# Patient Record
Sex: Male | Born: 1965 | Race: White | Hispanic: No | Marital: Married | Smoking: Never smoker
Health system: Southern US, Community
[De-identification: ages and names within clinical notes are randomized; demographics above are authoritative.]

## PROBLEM LIST (undated history)

## (undated) DIAGNOSIS — G4730 Sleep apnea, unspecified: Secondary | ICD-10-CM

## (undated) DIAGNOSIS — E119 Type 2 diabetes mellitus without complications: Secondary | ICD-10-CM

## (undated) DIAGNOSIS — K219 Gastro-esophageal reflux disease without esophagitis: Secondary | ICD-10-CM

## (undated) DIAGNOSIS — I10 Essential (primary) hypertension: Secondary | ICD-10-CM

## (undated) DIAGNOSIS — R0600 Dyspnea, unspecified: Secondary | ICD-10-CM

## (undated) HISTORY — PX: TOE SURGERY: SHX1073

## (undated) HISTORY — PX: HAND SURGERY: SHX662

## (undated) HISTORY — PX: WRIST SURGERY: SHX841

## (undated) HISTORY — PX: TONSILLECTOMY: SUR1361

---

## 2004-07-07 ENCOUNTER — Emergency Department: Payer: Self-pay | Admitting: Unknown Physician Specialty

## 2017-09-19 ENCOUNTER — Other Ambulatory Visit: Payer: Self-pay

## 2017-09-19 ENCOUNTER — Encounter: Payer: Self-pay | Admitting: Gynecology

## 2017-09-19 ENCOUNTER — Ambulatory Visit
Admission: EM | Admit: 2017-09-19 | Discharge: 2017-09-19 | Disposition: A | Discharge status: Home / Self Care | Payer: 59 | Attending: Family Medicine | Admitting: Family Medicine

## 2017-09-19 DIAGNOSIS — J069 Acute upper respiratory infection, unspecified: Secondary | ICD-10-CM | POA: Diagnosis not present

## 2017-09-19 DIAGNOSIS — R05 Cough: Secondary | ICD-10-CM

## 2017-09-19 MED ORDER — HYDROCOD POLST-CPM POLST ER 10-8 MG/5ML PO SUER: 5.0000 mL | Freq: Two times a day (BID) | ORAL | 0 refills | Status: DC

## 2017-09-19 MED ORDER — BENZONATATE 200 MG PO CAPS: ORAL_CAPSULE | ORAL | 0 refills | Status: DC

## 2017-09-19 MED ORDER — AZITHROMYCIN 250 MG PO TABS: 250.0000 mg | ORAL_TABLET | Freq: Every day | ORAL | 0 refills | Status: DC

## 2017-09-19 MED ORDER — ALBUTEROL SULFATE HFA 108 (90 BASE) MCG/ACT IN AERS: 1.0000 | INHALATION_SPRAY | Freq: Four times a day (QID) | RESPIRATORY_TRACT | 0 refills | Status: DC | PRN

## 2017-09-19 NOTE — ED Triage Notes (Signed)
Patient c/o cough and chest congestion x couple weeks.

## 2017-09-19 NOTE — ED Provider Notes (Signed)
[facility]    CSN: [id number] Arrival date & time: 09/19/17  1016     History   Chief Complaint Chief Complaint  Patient presents with  . Cough    HPI Jack Barajas is a 52 y.o. male.   HPI  This is a 52 year old male presents with a cough and congestion he has had since Thanksgiving.  States that his upper cold in his nose and throat improved with over-the-counter medications but he is been coughing nonproductively since Thanksgiving.  Last couple weeks it reemerged only worse.  He is a non-smoker.  He states that he has had no fever or chills although his wife told him he felt clammy last night.  Present today.  Dates he has a wheezing sensation at nighttime and his symptoms always seem to be worse at night although he will be coughing during the daytime.        History reviewed. No pertinent past medical history.  There are no active problems to display for this patient.   Past Surgical History:  Procedure Laterality Date  . TOE SURGERY    . WRIST SURGERY         Home Medications    Prior to Admission medications   Medication Sig Start Date End Date Taking? Authorizing Provider  albuterol (PROVENTIL HFA;VENTOLIN HFA) 108 (90 Base) MCG/ACT inhaler Inhale 1-2 puffs into the lungs every 6 (six) hours as needed for wheezing or shortness of breath. Use with spacer 09/19/17   Lorin Picket, PA-C  azithromycin (ZITHROMAX) 250 MG tablet Take 1 tablet (250 mg total) by mouth daily. Take first 2 tablets together, then 1 every day until finished. 09/19/17   Lorin Picket, PA-C  benzonatate (TESSALON) 200 MG capsule Take one cap TID PRN cough 09/19/17   Lorin Picket, PA-C  chlorpheniramine-HYDROcodone [facility]) 10-8 MG/5ML SUER Take 5 mLs by mouth 2 (two) times daily. 09/19/17   Lorin Picket, PA-C    Family History Family History  Problem Relation Age of Onset  . Hypertension Mother   . Cancer Father        bone    Social  History Social History   Tobacco Use  . Smoking status: Never Smoker  . Smokeless tobacco: Never Used  Substance Use Topics  . Alcohol use: Yes  . Drug use: No     Allergies   Patient has no known allergies.   Review of Systems Review of Systems  Constitutional: Positive for activity change. Negative for chills, fatigue and fever.  HENT: Positive for congestion.   Respiratory: Positive for cough, shortness of breath and wheezing.   All other systems reviewed and are negative.    Physical Exam Triage Vital Signs ED Triage Vitals  Enc Vitals Group     BP 09/19/17 1039 (!) 139/94     Pulse Rate 09/19/17 1039 90     Resp 09/19/17 1039 18     Temp 09/19/17 1039 98.4 F (36.9 C)     Temp Source 09/19/17 1039 Oral     SpO2 09/19/17 1039 98 %     Weight 09/19/17 1036 285 lb (129.3 kg)     Height 09/19/17 1036 6\' 1"  (1.854 m)     Head Circumference --      Peak Flow --      Pain Score 09/19/17 1036 3     Pain Loc --      Pain Edu? --  Excl. in [place]? --    No data found.  Updated Vital Signs BP (!) 139/94 (BP Location: Left Arm)   Pulse 90   Temp 98.4 F (36.9 C) (Oral)   Resp 18   Ht 6\' 1"  (1.854 m)   Wt 285 lb (129.3 kg)   SpO2 98%   BMI 37.60 kg/m   Visual Acuity Right Eye Distance:   Left Eye Distance:   Bilateral Distance:    Right Eye Near:   Left Eye Near:    Bilateral Near:     Physical Exam  Constitutional: He is oriented to person, place, and time. He appears well-developed and well-nourished. No distress.  HENT:  Head: Normocephalic.  Right Ear: External ear normal.  Left Ear: External ear normal.  Nose: Nose normal.  Mouth/Throat: Oropharynx is clear and moist. No oropharyngeal exudate.  Eyes: Pupils are equal, round, and reactive to light. Right eye exhibits no discharge. Left eye exhibits no discharge.  Neck: Normal range of motion.  Pulmonary/Chest: Effort normal and breath sounds normal.  Musculoskeletal: Normal range of motion.    Lymphadenopathy:    He has no cervical adenopathy.  Neurological: He is alert and oriented to person, place, and time.  Skin: Skin is warm and dry. He is not diaphoretic.  Psychiatric: He has a normal mood and affect. His behavior is normal. Judgment and thought content normal.  Nursing note and vitals reviewed.    UC Treatments / Results  Labs (all labs ordered are listed, but only abnormal results are displayed) Labs Reviewed - No data to display  EKG  EKG Interpretation None       Radiology No results found.  Procedures Procedures (including critical care time)  Medications Ordered in UC Medications - No data to display   Initial Impression / Assessment and Plan / UC Course  I have reviewed the triage vital signs and the nursing notes.  Pertinent labs & imaging results that were available during my care of the patient were reviewed by me and considered in my medical decision making (see chart for details).     Plan: 1. Test/x-ray results and diagnosis reviewed with patient 2. rx as per orders; risks, benefits, potential side effects reviewed with patient 3. Recommend supportive treatment with rest and hydration. Since  He has symptoms since Thanksgiving and  not improved, I will prescribe a course of azithromycin.  In addition I will treat his cough symptomatically.  If he is not improving I recommend that he follow-up with a primary care physician of his choice. 4. F/u prn if symptoms worsen or don't improve   Final Clinical Impressions(s) / UC Diagnoses   Final diagnoses:  Upper respiratory tract infection, unspecified type    ED Discharge Orders        Ordered    chlorpheniramine-HYDROcodone (TUSSIONEX PENNKINETIC ER) 10-8 MG/5ML SUER  2 times daily     09/19/17 1121    benzonatate (TESSALON) 200 MG capsule     09/19/17 1121    albuterol (PROVENTIL HFA;VENTOLIN HFA) 108 (90 Base) MCG/ACT inhaler  Every 6 hours PRN    Comments:  Provide spacer and  instructions to patient   09/19/17 1121    azithromycin (ZITHROMAX) 250 MG tablet  Daily     09/19/17 1121       Controlled Substance Prescriptions  Controlled Substance Registry consulted? Not Applicable   Lorin Picket, PA-C 09/19/17 1128

## 2018-03-21 ENCOUNTER — Ambulatory Visit (INDEPENDENT_AMBULATORY_CARE_PROVIDER_SITE_OTHER): Payer: 59

## 2018-03-21 ENCOUNTER — Other Ambulatory Visit: Payer: Self-pay

## 2018-03-21 ENCOUNTER — Encounter: Payer: Self-pay | Admitting: Emergency Medicine

## 2018-03-21 ENCOUNTER — Ambulatory Visit
Admission: EM | Admit: 2018-03-21 | Discharge: 2018-03-21 | Disposition: A | Discharge status: Home / Self Care | Payer: 59 | Attending: Family Medicine | Admitting: Family Medicine

## 2018-03-21 DIAGNOSIS — J181 Lobar pneumonia, unspecified organism: Secondary | ICD-10-CM

## 2018-03-21 DIAGNOSIS — R05 Cough: Secondary | ICD-10-CM

## 2018-03-21 DIAGNOSIS — R059 Cough, unspecified: Secondary | ICD-10-CM

## 2018-03-21 DIAGNOSIS — J189 Pneumonia, unspecified organism: Secondary | ICD-10-CM

## 2018-03-21 MED ORDER — HYDROCOD POLST-CPM POLST ER 10-8 MG/5ML PO SUER: 5.0000 mL | Freq: Every evening | ORAL | 0 refills | Status: DC | PRN

## 2018-03-21 MED ORDER — PREDNISONE 10 MG PO TABS: ORAL_TABLET | ORAL | 0 refills | Status: DC

## 2018-03-21 MED ORDER — BENZONATATE 100 MG PO CAPS: 100.0000 mg | ORAL_CAPSULE | Freq: Three times a day (TID) | ORAL | 0 refills | Status: DC | PRN

## 2018-03-21 MED ORDER — LEVOFLOXACIN 750 MG PO TABS: 750.0000 mg | ORAL_TABLET | Freq: Every day | ORAL | 0 refills | Status: DC

## 2018-03-21 MED ORDER — ALBUTEROL SULFATE HFA 108 (90 BASE) MCG/ACT IN AERS: 2.0000 | INHALATION_SPRAY | RESPIRATORY_TRACT | 0 refills | Status: DC | PRN

## 2018-03-21 MED ORDER — IPRATROPIUM-ALBUTEROL 0.5-2.5 (3) MG/3ML IN SOLN
3.0000 mL | Freq: Once | RESPIRATORY_TRACT | Status: AC
  Administered 2018-03-21: 3 mL via RESPIRATORY_TRACT

## 2018-03-21 NOTE — ED Triage Notes (Signed)
Patient in today c/o non-productive cough x 1 week. Patient was seen at an Urgent Care in [place] on 03/16/18 and was prescribed a Zpak, Tussionex and Tessalon Perles. Patient has finished his medication, but isn't any better.

## 2018-03-21 NOTE — Discharge Instructions (Addendum)
Take medication as prescribed. Rest. Drink plenty of fluids.  ° °Follow up with your primary care physician this week as needed. Return to Urgent care for new or worsening concerns.  ° °

## 2018-03-21 NOTE — ED Provider Notes (Signed)
[facility] ____________________________________________  Time seen: Approximately 11:39 AM  I have reviewed the triage vital signs and the nursing notes.   HISTORY  Chief Complaint Cough   HPI Jack Barajas is a 52 y.o. male presenting for evaluation of cough and congestion symptoms that have been present for 1 week.  Patient states symptoms started on Friday and he was seen in urgent care in [place] on Sunday.  States that time he was given cough medication and azithromycin in which she has completed and symptoms continue.  Reports intermittent wheezing.  States cough is disrupting sleep.  States cough is a nonproductive hacking cough.  Denies associated fevers.  No chest pain or shortness of breath.  Does report recent travel to [place], denies any atypical exposures.  No extremity pain or extremity swelling.  Continues to eat and drink well.  States unresolved with prescription medications as well as over-the-counter.  Denies home sick contacts.  Denies other aggravating or alleviating factors.  Denies any history of pneumonia, asthma, COPD or smoking.  Denies chest pain, shortness of breath, abdominal pain,extremity pain, extremity swelling or rash.   History reviewed. No pertinent past medical history.  There are no active problems to display for this patient.   Past Surgical History:  Procedure Laterality Date  . TOE SURGERY    . WRIST SURGERY       No current facility-administered medications for this encounter.   Current Outpatient Medications:  .  albuterol (PROVENTIL HFA;VENTOLIN HFA) 108 (90 Base) MCG/ACT inhaler, Inhale 2 puffs into the lungs every 4 (four) hours as needed., Disp: 1 Inhaler, Rfl: 0 .  benzonatate (TESSALON PERLES) 100 MG capsule, Take 1 capsule (100 mg total) by mouth 3 (three) times daily as needed., Disp: 15 capsule, Rfl: 0 .  chlorpheniramine-HYDROcodone (TUSSIONEX PENNKINETIC ER) 10-8 MG/5ML SUER, Take 5 mLs by mouth at  bedtime as needed for cough. do not drive or operate machinery while taking as can cause drowsiness., Disp: 50 mL, Rfl: 0 .  levofloxacin (LEVAQUIN) 750 MG tablet, Take 1 tablet (750 mg total) by mouth daily., Disp: 5 tablet, Rfl: 0 .  predniSONE (DELTASONE) 10 MG tablet, Start 60 mg po day one, then 50 mg po day two, taper by 10 mg daily until complete., Disp: 21 tablet, Rfl: 0  Allergies Patient has no known allergies.  Family History  Problem Relation Age of Onset  . Hypertension Mother   . Cancer Father        bone    Social History Social History   Tobacco Use  . Smoking status: Never Smoker  . Smokeless tobacco: Never Used  Substance Use Topics  . Alcohol use: Yes    Comment: occassionally  . Drug use: No    Review of Systems Constitutional: No fever/chills ENT: No sore throat. Cardiovascular: Denies chest pain. Respiratory: Denies shortness of breath. Gastrointestinal: No abdominal pain.   Musculoskeletal: Negative for back pain. Skin: Negative for rash.  ____________________________________________   PHYSICAL EXAM:  VITAL SIGNS: ED Triage Vitals  Enc Vitals Group     BP 03/21/18 1034 (!) 139/94     Pulse Rate 03/21/18 1034 (!) 102     Resp 03/21/18 1034 16     Temp 03/21/18 1034 98.4 F (36.9 C)     Temp Source 03/21/18 1034 Oral     SpO2 03/21/18 1034 97 %     Weight 03/21/18 1035 270 lb (122.5 kg)     Height 03/21/18 1035 6' (  1.829 m)     Head Circumference --      Peak Flow --      Pain Score 03/21/18 1035 4     Pain Loc --      Pain Edu? --      Excl. in [place]? --     Constitutional: Alert and oriented. Well appearing and in no acute distress. Eyes: Conjunctivae are normal. Head: Atraumatic. No sinus tenderness to palpation. No swelling. No erythema.  Ears: no erythema, normal TMs bilaterally.   Nose:Nasal congestion with clear rhinorrhea  Mouth/Throat: Mucous membranes are moist. No pharyngeal erythema. No tonsillar swelling or exudate.    Neck: No stridor.  No cervical spine tenderness to palpation. Hematological/Lymphatic/Immunilogical: No cervical lymphadenopathy. Cardiovascular: Normal rate, regular rhythm. Grossly normal heart sounds.  Good peripheral circulation. Respiratory: Normal respiratory effort.  No retractions.  Diminished bilateral base sounds.  Mild scattered rhonchi.  Mild scattered wheezes.  Dry intermittent cough noted in room.  Speaks in complete sentences. Musculoskeletal: Ambulatory with steady gait. No cervical, thoracic or lumbar tenderness to palpation. Neurologic:  Normal speech and language. No gait instability. Skin:  Skin appears warm, dry and intact. No rash noted. Psychiatric: Mood and affect are normal. Speech and behavior are normal.  ___________________________________________   LABS (all labs ordered are listed, but only abnormal results are displayed)  Labs Reviewed - No data to display  RADIOLOGY  Dg Chest 2 View  Result Date: 03/21/2018 CLINICAL DATA:  Chest pressure and wheezing over the last week with shortness of breath and dry cough. EXAM: CHEST - 2 VIEW COMPARISON:  None. FINDINGS: Heart size is normal. There is patchy infiltrate/atelectasis in both lower lungs. No dense consolidation or lobar collapse. No effusions. No acute bone finding. IMPRESSION: Patchy pneumonia in both lower lungs. Electronically Signed   By: Nelson Chimes M.D.   On: 03/21/2018 11:07   ____________________________________________   PROCEDURES Procedures   INITIAL IMPRESSION / ASSESSMENT AND PLAN / ED COURSE  Pertinent labs & imaging results that were available during my care of the patient were reviewed by me and considered in my medical decision making (see chart for details).  Well-appearing patient.  No acute distress.  DuoNeb given in urgent care, post reevaluation, wheezes improved.  Rhonchi continued.  Chest x-ray as above per radiologist, patchy pneumonia in both lower lobes.  As patient just  completed a azithromycin, will treat patient with oral Levaquin, counseled regarding fluids and initiate his activity due to tendon risk.  Also treat with prednisone taper, albuterol inhaler, PRN Tussionex and PRN Tessalon Perles.   Discussed her follow-up and return parameters.Discussed indication, risks and benefits of medications with patient.  Discussed follow up with Primary care physician this week. Discussed follow up and return parameters including no resolution or any worsening concerns. Patient verbalized understanding and agreed to plan.   ____________________________________________   FINAL CLINICAL IMPRESSION(S) / ED DIAGNOSES  Final diagnoses:  Pneumonia of both lower lobes due to infectious organism [facility])  Cough     ED Discharge Orders        Ordered    levofloxacin (LEVAQUIN) 750 MG tablet  Daily     03/21/18 1124    predniSONE (DELTASONE) 10 MG tablet     03/21/18 1124    albuterol (PROVENTIL HFA;VENTOLIN HFA) 108 (90 Base) MCG/ACT inhaler  Every 4 hours PRN     03/21/18 1124    chlorpheniramine-HYDROcodone (TUSSIONEX PENNKINETIC ER) 10-8 MG/5ML SUER  At bedtime PRN  03/21/18 1124    benzonatate (TESSALON PERLES) 100 MG capsule  3 times daily PRN     03/21/18 1124       Note: This dictation was prepared with Dragon dictation along with smaller phrase technology. Any transcriptional errors that result from this process are unintentional.         Marylene Land, NP 03/21/18 1142

## 2020-02-22 DIAGNOSIS — M170 Bilateral primary osteoarthritis of knee: Secondary | ICD-10-CM | POA: Insufficient documentation

## 2020-02-23 DIAGNOSIS — E7800 Pure hypercholesterolemia, unspecified: Secondary | ICD-10-CM | POA: Insufficient documentation

## 2020-09-15 ENCOUNTER — Other Ambulatory Visit: Payer: Self-pay

## 2020-09-15 ENCOUNTER — Ambulatory Visit (INDEPENDENT_AMBULATORY_CARE_PROVIDER_SITE_OTHER): Payer: 59

## 2020-09-15 ENCOUNTER — Ambulatory Visit
Admission: RE | Admit: 2020-09-15 | Discharge: 2020-09-15 | Disposition: A | Discharge status: Home / Self Care | Payer: 59 | Source: Ambulatory Visit | Attending: Physician Assistant | Admitting: Physician Assistant

## 2020-09-15 VITALS — BP 139/107 | HR 103 | Temp 98.6°F | Resp 16

## 2020-09-15 DIAGNOSIS — U071 COVID-19: Secondary | ICD-10-CM

## 2020-09-15 DIAGNOSIS — J189 Pneumonia, unspecified organism: Secondary | ICD-10-CM

## 2020-09-15 DIAGNOSIS — R059 Cough, unspecified: Secondary | ICD-10-CM | POA: Diagnosis not present

## 2020-09-15 LAB — RESP PANEL BY RT-PCR (FLU A&B, COVID) ARPGX2
Influenza A by PCR: NEGATIVE
Influenza B by PCR: NEGATIVE
SARS Coronavirus 2 by RT PCR: POSITIVE — AB

## 2020-09-15 MED ORDER — DOXYCYCLINE HYCLATE 100 MG PO CAPS: 100.0000 mg | ORAL_CAPSULE | Freq: Two times a day (BID) | ORAL | 0 refills | Status: DC

## 2020-09-15 MED ORDER — PROMETHAZINE-DM 6.25-15 MG/5ML PO SYRP: 5.0000 mL | ORAL_SOLUTION | Freq: Every evening | ORAL | 0 refills | Status: DC | PRN

## 2020-09-15 NOTE — Discharge Instructions (Signed)
Your Covid test is positive.  You must isolate another 7 days.  We did a chest x-ray since you have been having a cough prior to that for a couple of months.  The chest x-ray does show that you have pneumonia of your left lower lobe.  I am unsure if you already had walking pneumonia and then you acquired Covid or if you have COVID type pneumonia.  Since I am uncertain, I am sending doxycycline to the pharmacy for you.  I have also sent Promethazine DM cough syrup for you to take at nighttime.  Take Mucinex D during the day and increase your fluid intake.  Since you are unvaccinated and you have pneumonia as well as history of prediabetes and high cholesterol you are at a higher risk for getting sicker.  I will contact the infusion center for consideration of monoclonal antibody therapy.  If you are a candidate, someone will reach out to you in the next 2 days.  If you start to feel worse, go to ED.  If you have any fevers, increased breathing difficulty, weakness, pain your chest call EMS or go to ED.

## 2020-09-15 NOTE — ED Provider Notes (Signed)
[facility]    CSN: [id number] Arrival date & time: 09/15/20  1249      History   Chief Complaint Chief Complaint  Patient presents with  . Cough    HPI Jack Barajas is a 54 y.o. male who presents for intermittent cough for the past 3 to 4 months that seems to be worse at night.  Patient states that it had not been associated with any other symptoms and seemed pretty consistent until about 3 days ago when the cough seemed to get worse.  Cough is occasionally productive.  He denies any associated fever.  He has been a little bit more fatigued.  Denies any chest pain, but says it feels like something is stuck that he needs to cough up.  No body aches, weakness, breathing difficulty.  No nasal congestion, sore throat, back pain, abdominal pain, N/V/D, or change in smell and taste.  Denies any known COVID-19 exposure and has not been vaccinated for COVID-19.  Past medical history is significant for hyperlipidemia and prediabetes.  Denies any history of COPD, asthma or cardiac disease.  Has been taking Mucinex as of recently.  He has no other complaints or concerns today.  HPI  History reviewed. No pertinent past medical history.  There are no problems to display for this patient.   Past Surgical History:  Procedure Laterality Date  . TOE SURGERY    . WRIST SURGERY         Home Medications    Prior to Admission medications   Medication Sig Start Date End Date Taking? Authorizing Provider  doxycycline (VIBRAMYCIN) 100 MG capsule Take 1 capsule (100 mg total) by mouth 2 (two) times daily for 7 days. 09/15/20 09/22/20 Yes Shirlee Latch, PA-C  metFORMIN (GLUCOPHAGE-XR) 500 MG 24 hr tablet Take by mouth. 02/23/20 02/22/21 Yes [provider]  promethazine-dextromethorphan (PROMETHAZINE-DM) 6.25-15 MG/5ML syrup Take 5 mLs by mouth at bedtime as needed for cough. 09/15/20  Yes Eusebio Friendly B, PA-C  lovastatin (MEVACOR) 10 MG tablet Take 10 mg by mouth daily.  08/28/20   [provider]  albuterol (PROVENTIL HFA;VENTOLIN HFA) 108 (90 Base) MCG/ACT inhaler Inhale 2 puffs into the lungs every 4 (four) hours as needed. 03/21/18 09/15/20  Renford Dills, NP    Family History Family History  Problem Relation Age of Onset  . Hypertension Mother   . Cancer Father        bone    Social History Social History   Tobacco Use  . Smoking status: Never Smoker  . Smokeless tobacco: Never Used  Vaping Use  . Vaping Use: Never used  Substance Use Topics  . Alcohol use: Yes    Comment: occassionally  . Drug use: No     Allergies   Patient has no known allergies.   Review of Systems Review of Systems  Constitutional: Positive for fatigue. Negative for fever.  HENT: Positive for congestion. Negative for rhinorrhea, sinus pressure, sinus pain and sore throat.   Respiratory: Positive for cough. Negative for shortness of breath and wheezing.   Cardiovascular: Negative for chest pain.  Gastrointestinal: Negative for abdominal pain, diarrhea, nausea and vomiting.  Musculoskeletal: Negative for myalgias.  Neurological: Negative for weakness, light-headedness and headaches.  Hematological: Negative for adenopathy.     Physical Exam Triage Vital Signs ED Triage Vitals  Enc Vitals Group     BP 09/15/20 1349 (S) (!) 139/107     Pulse Rate 09/15/20 1349 (S) (!) 103  Resp 09/15/20 1346 16     Temp 09/15/20 1349 98.6 F (37 C)     Temp Source 09/15/20 1349 Oral     SpO2 09/15/20 1349 95 %     Weight --      Height --      Head Circumference --      Peak Flow --      Pain Score 09/15/20 1342 0     Pain Loc --      Pain Edu? --      Excl. in [place]? --    No data found.  Updated Vital Signs BP (S) (!) 139/107 (BP Location: Left Arm) Comment: did not take BP meds  Pulse (S) (!) 103   Temp 98.6 F (37 C) (Oral)   Resp 16   SpO2 95%       Physical Exam Vitals and nursing note reviewed.  Constitutional:      General: He is  not in acute distress.    Appearance: Normal appearance. He is well-developed and well-nourished. He is obese. He is not ill-appearing or diaphoretic.  HENT:     Head: Normocephalic and atraumatic.     Nose: Nose normal.     Mouth/Throat:     Mouth: Oropharynx is clear and moist and mucous membranes are normal. Mucous membranes are moist.     Pharynx: Oropharynx is clear. Uvula midline. No oropharyngeal exudate.     Tonsils: No tonsillar abscesses.  Eyes:     General: No scleral icterus.       Right eye: No discharge.        Left eye: No discharge.     Extraocular Movements: EOM normal.     Conjunctiva/sclera: Conjunctivae normal.  Neck:     Thyroid: No thyromegaly.     Trachea: No tracheal deviation.  Cardiovascular:     Rate and Rhythm: Normal rate and regular rhythm.     Heart sounds: Normal heart sounds.  Pulmonary:     Effort: Pulmonary effort is normal. No respiratory distress.     Breath sounds: Normal breath sounds. No wheezing or rales.  Musculoskeletal:     Cervical back: Normal range of motion and neck supple.  Lymphadenopathy:     Cervical: No cervical adenopathy.  Skin:    General: Skin is warm and dry.     Findings: No rash.  Neurological:     General: No focal deficit present.     Mental Status: He is alert. Mental status is at baseline.     Motor: No weakness.     Gait: Gait normal.  Psychiatric:        Mood and Affect: Mood normal.        Behavior: Behavior normal.        Thought Content: Thought content normal.      UC Treatments / Results  Labs (all labs ordered are listed, but only abnormal results are displayed) Labs Reviewed  RESP PANEL BY RT-PCR (FLU A&B, COVID) ARPGX2 - Abnormal; Notable for the following components:      Result Value   SARS Coronavirus 2 by RT PCR POSITIVE (*)    All other components within normal limits    EKG   Radiology DG Chest 2 View  Result Date: 09/15/2020 CLINICAL DATA:  Cough for 3-4 months EXAM: CHEST - 2  VIEW COMPARISON:  03/21/2018 FINDINGS: Patchy left lower lobe airspace disease which may reflect atelectasis versus pneumonia. No pleural effusion or pneumothorax. Heart and mediastinal  contours are unremarkable. No acute osseous abnormality. IMPRESSION: Patchy left lower lobe airspace disease which may reflect atelectasis versus pneumonia. Electronically Signed   By: Kathreen Devoid   On: 09/15/2020 14:33    Procedures Procedures (including critical care time)  Medications Ordered in UC Medications - No data to display  Initial Impression / Assessment and Plan / UC Course  I have reviewed the triage vital signs and the nursing notes.  Pertinent labs & imaging results that were available during my care of the patient were reviewed by me and considered in my medical decision making (see chart for details).   53 year old male with 3 to 81-month history of cough that got worse over the past 3 days.  Blood pressure elevated at 139/107.  Pulse elevated at 103.  Other vital signs are stable and normal.  On exam, he is not ill-appearing.  He is in no acute distress.  Chest is clear to auscultation.  Respiratory panel obtained since symptoms recently worsened.  Chest x-ray obtained due to duration of cough.  Respiratory panel is positive for COVID-19.  CDC guidelines, isolation protocol, and ED precautions reviewed with patient.  Advised boarding care with increasing rest and fluids.  Chest x-ray which has been independently reviewed by me shows patchy left lower airspace disease.  This could be consistent with pneumonia.  I suspect that he may have had walking pneumonia over the past couple months and then picked up Covid.  Advised him that it is also possible that he has this pneumonia due to Covid and it is new.  Advised patient that since not sure we will treat him for community-acquired pneumonia/walking pneumonia with doxycycline.  I did give patient's information to the infusion clinic for MAB  therapy, but advised patient the list is long and not sure if he will be prioritized.  Reviewed ED precautions with patient.   Final Clinical Impressions(s) / UC Diagnoses   Final diagnoses:  [id number]  Community acquired pneumonia of left lower lobe of lung  Cough     Discharge Instructions     Your Covid test is positive.  You must isolate another 7 days.  We did a chest x-ray since you have been having a cough prior to that for a couple of months.  The chest x-ray does show that you have pneumonia of your left lower lobe.  I am unsure if you already had walking pneumonia and then you acquired Covid or if you have COVID type pneumonia.  Since I am uncertain, I am sending doxycycline to the pharmacy for you.  I have also sent Promethazine DM cough syrup for you to take at nighttime.  Take Mucinex D during the day and increase your fluid intake.  Since you are unvaccinated and you have pneumonia as well as history of prediabetes and high cholesterol you are at a higher risk for getting sicker.  I will contact the infusion center for consideration of monoclonal antibody therapy.  If you are a candidate, someone will reach out to you in the next 2 days.  If you start to feel worse, go to ED.  If you have any fevers, increased breathing difficulty, weakness, pain your chest call EMS or go to ED.   ED Prescriptions    Medication Sig Dispense Auth. Provider   doxycycline (VIBRAMYCIN) 100 MG capsule Take 1 capsule (100 mg total) by mouth 2 (two) times daily for 7 days. 14 capsule Danton Clap, PA-C   promethazine-dextromethorphan (PROMETHAZINE-DM) 6.25-15  MG/5ML syrup Take 5 mLs by mouth at bedtime as needed for cough. 118 mL Danton Clap, PA-C     PDMP not reviewed this encounter.   Danton Clap, PA-C 09/15/20 1507

## 2020-09-15 NOTE — ED Triage Notes (Signed)
Patient presents to Urgent Care with complaints of a cough since 3-4 months. Patient reports cough worse Monday. Treating with mucinex with some relief.   Denies fever.

## 2020-09-17 DIAGNOSIS — U071 COVID-19: Secondary | ICD-10-CM

## 2020-09-17 HISTORY — DX: COVID-19: U07.1

## 2020-09-21 ENCOUNTER — Emergency Department: Payer: 59

## 2020-09-21 ENCOUNTER — Inpatient Hospital Stay
Admission: EM | Admit: 2020-09-21 | Discharge: 2020-10-01 | DRG: 177 | Disposition: A | Discharge status: Home / Self Care | Payer: 59 | Attending: Internal Medicine | Admitting: Internal Medicine

## 2020-09-21 ENCOUNTER — Encounter: Payer: Self-pay | Admitting: Emergency Medicine

## 2020-09-21 ENCOUNTER — Other Ambulatory Visit: Payer: Self-pay

## 2020-09-21 DIAGNOSIS — I10 Essential (primary) hypertension: Secondary | ICD-10-CM | POA: Diagnosis present

## 2020-09-21 DIAGNOSIS — T380X5A Adverse effect of glucocorticoids and synthetic analogues, initial encounter: Secondary | ICD-10-CM | POA: Diagnosis not present

## 2020-09-21 DIAGNOSIS — J9601 Acute respiratory failure with hypoxia: Secondary | ICD-10-CM | POA: Diagnosis present

## 2020-09-21 DIAGNOSIS — R7401 Elevation of levels of liver transaminase levels: Secondary | ICD-10-CM | POA: Diagnosis present

## 2020-09-21 DIAGNOSIS — Z6836 Body mass index (BMI) 36.0-36.9, adult: Secondary | ICD-10-CM | POA: Diagnosis not present

## 2020-09-21 DIAGNOSIS — R0600 Dyspnea, unspecified: Secondary | ICD-10-CM

## 2020-09-21 DIAGNOSIS — Z79899 Other long term (current) drug therapy: Secondary | ICD-10-CM

## 2020-09-21 DIAGNOSIS — E1165 Type 2 diabetes mellitus with hyperglycemia: Secondary | ICD-10-CM | POA: Diagnosis not present

## 2020-09-21 DIAGNOSIS — Y92239 Unspecified place in hospital as the place of occurrence of the external cause: Secondary | ICD-10-CM | POA: Diagnosis not present

## 2020-09-21 DIAGNOSIS — U071 COVID-19: Principal | ICD-10-CM | POA: Diagnosis present

## 2020-09-21 DIAGNOSIS — E669 Obesity, unspecified: Secondary | ICD-10-CM | POA: Diagnosis present

## 2020-09-21 DIAGNOSIS — Z7984 Long term (current) use of oral hypoglycemic drugs: Secondary | ICD-10-CM

## 2020-09-21 DIAGNOSIS — J1282 Pneumonia due to coronavirus disease 2019: Secondary | ICD-10-CM | POA: Diagnosis present

## 2020-09-21 DIAGNOSIS — E119 Type 2 diabetes mellitus without complications: Secondary | ICD-10-CM

## 2020-09-21 DIAGNOSIS — Z8249 Family history of ischemic heart disease and other diseases of the circulatory system: Secondary | ICD-10-CM

## 2020-09-21 DIAGNOSIS — R0902 Hypoxemia: Secondary | ICD-10-CM

## 2020-09-21 HISTORY — DX: Essential (primary) hypertension: I10

## 2020-09-21 LAB — COMPREHENSIVE METABOLIC PANEL
ALT: 61 U/L — ABNORMAL HIGH (ref 0–44)
AST: 105 U/L — ABNORMAL HIGH (ref 15–41)
Albumin: 3.8 g/dL (ref 3.5–5.0)
Alkaline Phosphatase: 54 U/L (ref 38–126)
Anion gap: 12 (ref 5–15)
BUN: 16 mg/dL (ref 6–20)
CO2: 27 mmol/L (ref 22–32)
Calcium: 9 mg/dL (ref 8.9–10.3)
Chloride: 94 mmol/L — ABNORMAL LOW (ref 98–111)
Creatinine, Ser: 1.1 mg/dL (ref 0.61–1.24)
GFR, Estimated: >60 mL/min (ref >60)
Glucose, Bld: 193 mg/dL — ABNORMAL HIGH (ref 70–99)
Potassium: 3.9 mmol/L (ref 3.5–5.1)
Sodium: 133 mmol/L — ABNORMAL LOW (ref 135–145)
Total Bilirubin: 0.9 mg/dL (ref 0.3–1.2)
Total Protein: 7.7 g/dL (ref 6.5–8.1)

## 2020-09-21 LAB — CBC WITH DIFFERENTIAL/PLATELET
Abs Immature Granulocytes: 0.09 10*3/uL — ABNORMAL HIGH (ref 0.00–0.07)
Basophils Absolute: 0 10*3/uL (ref 0.0–0.1)
Basophils Relative: 0 %
Eosinophils Absolute: 0 10*3/uL (ref 0.0–0.5)
Eosinophils Relative: 0 %
HCT: 42.2 % (ref 39.0–52.0)
Hemoglobin: 13.5 g/dL (ref 13.0–17.0)
Immature Granulocytes: 1 %
Lymphocytes Relative: 8 %
Lymphs Abs: 1.1 10*3/uL (ref 0.7–4.0)
MCH: 28.8 pg (ref 26.0–34.0)
MCHC: 32 g/dL (ref 30.0–36.0)
MCV: 90.2 fL (ref 80.0–100.0)
Monocytes Absolute: 0.7 10*3/uL (ref 0.1–1.0)
Monocytes Relative: 5 %
Neutro Abs: 11.1 10*3/uL — ABNORMAL HIGH (ref 1.7–7.7)
Neutrophils Relative %: 86 %
Platelets: 242 10*3/uL (ref 150–400)
RBC: 4.68 MIL/uL (ref 4.22–5.81)
RDW: 13.3 % (ref 11.5–15.5)
WBC: 12.9 10*3/uL — ABNORMAL HIGH (ref 4.0–10.5)
nRBC: 0 % (ref 0.0–0.2)

## 2020-09-21 LAB — CBG MONITORING, ED
Glucose-Capillary: 182 mg/dL — ABNORMAL HIGH (ref 70–99)
Glucose-Capillary: 206 mg/dL — ABNORMAL HIGH (ref 70–99)

## 2020-09-21 LAB — TROPONIN I (HIGH SENSITIVITY)
Troponin I (High Sensitivity): 25 ng/L — ABNORMAL HIGH (ref <18)
Troponin I (High Sensitivity): 18 ng/L — ABNORMAL HIGH (ref <18)

## 2020-09-21 LAB — FERRITIN: Ferritin: 481 ng/mL — ABNORMAL HIGH (ref 24–336)

## 2020-09-21 LAB — D-DIMER, QUANTITATIVE (NOT AT ARMC): D-Dimer, Quant: 0.57 ug/mL-FEU — ABNORMAL HIGH (ref 0.00–0.50)

## 2020-09-21 LAB — PROCALCITONIN: Procalcitonin: 0.22 ng/mL

## 2020-09-21 LAB — C-REACTIVE PROTEIN: CRP: 16.8 mg/dL — ABNORMAL HIGH (ref <1.0)

## 2020-09-21 MED ORDER — ACETAMINOPHEN 325 MG PO TABS: 650.0000 mg | ORAL_TABLET | Freq: Four times a day (QID) | ORAL | Status: DC | PRN

## 2020-09-21 MED ORDER — SODIUM CHLORIDE 0.9 % IV SOLN
200.0000 mg | Freq: Once | INTRAVENOUS | Status: AC
  Administered 2020-09-21: 200 mg via INTRAVENOUS
  Filled 2020-09-21: qty 200

## 2020-09-21 MED ORDER — INSULIN ASPART 100 UNIT/ML ~~LOC~~ SOLN
0.0000 [IU] | Freq: Three times a day (TID) | SUBCUTANEOUS | Status: DC
  Administered 2020-09-21: 3 [IU] via SUBCUTANEOUS
  Administered 2020-09-22 (×2): 5 [IU] via SUBCUTANEOUS
  Administered 2020-09-22: 3 [IU] via SUBCUTANEOUS
  Administered 2020-09-23: 8 [IU] via SUBCUTANEOUS
  Administered 2020-09-23: 09:00:00 5 [IU] via SUBCUTANEOUS
  Administered 2020-09-23: 17:00:00 15 [IU] via SUBCUTANEOUS
  Administered 2020-09-24: 09:00:00 8 [IU] via SUBCUTANEOUS
  Administered 2020-09-24: 13:00:00 15 [IU] via SUBCUTANEOUS
  Administered 2020-09-24: 11 [IU] via SUBCUTANEOUS
  Administered 2020-09-25 (×2): 15 [IU] via SUBCUTANEOUS
  Administered 2020-09-25: 09:00:00 5 [IU] via SUBCUTANEOUS
  Administered 2020-09-26 (×2): 8 [IU] via SUBCUTANEOUS
  Administered 2020-09-26: 17:00:00 15 [IU] via SUBCUTANEOUS
  Administered 2020-09-27: 18 [IU] via SUBCUTANEOUS
  Administered 2020-09-27: 09:00:00 5 [IU] via SUBCUTANEOUS
  Administered 2020-09-27 – 2020-09-28 (×2): 11 [IU] via SUBCUTANEOUS
  Administered 2020-09-28: 8 [IU] via SUBCUTANEOUS
  Administered 2020-09-28: 09:00:00 5 [IU] via SUBCUTANEOUS
  Administered 2020-09-29: 08:00:00 2 [IU] via SUBCUTANEOUS
  Administered 2020-09-29: 18:00:00 12 [IU] via SUBCUTANEOUS
  Administered 2020-09-30: 8 [IU] via SUBCUTANEOUS
  Administered 2020-09-30: 09:00:00 3 [IU] via SUBCUTANEOUS
  Administered 2020-09-30: 8 [IU] via SUBCUTANEOUS
  Administered 2020-10-01: 3 [IU] via SUBCUTANEOUS
  Administered 2020-10-01: 13:00:00 5 [IU] via SUBCUTANEOUS
  Filled 2020-09-21 (×30): qty 1

## 2020-09-21 MED ORDER — DEXAMETHASONE 6 MG PO TABS
6.0000 mg | ORAL_TABLET | ORAL | Status: DC
  Filled 2020-09-21: qty 1

## 2020-09-21 MED ORDER — GUAIFENESIN-DM 100-10 MG/5ML PO SYRP: 10.0000 mL | ORAL_SOLUTION | ORAL | Status: DC | PRN

## 2020-09-21 MED ORDER — INSULIN ASPART 100 UNIT/ML ~~LOC~~ SOLN
0.0000 [IU] | Freq: Every day | SUBCUTANEOUS | Status: DC
  Administered 2020-09-21: 2 [IU] via SUBCUTANEOUS
  Administered 2020-09-23 (×2): 3 [IU] via SUBCUTANEOUS
  Administered 2020-09-24: 21:00:00 4 [IU] via SUBCUTANEOUS
  Administered 2020-09-25 – 2020-09-27 (×3): 5 [IU] via SUBCUTANEOUS
  Administered 2020-09-28: 22:00:00 4 [IU] via SUBCUTANEOUS
  Administered 2020-09-29 – 2020-09-30 (×2): 3 [IU] via SUBCUTANEOUS
  Filled 2020-09-21 (×10): qty 1

## 2020-09-21 MED ORDER — HYDROCOD POLST-CPM POLST ER 10-8 MG/5ML PO SUER
5.0000 mL | Freq: Two times a day (BID) | ORAL | Status: DC | PRN
  Administered 2020-09-21: 5 mL via ORAL
  Filled 2020-09-21: qty 5

## 2020-09-21 MED ORDER — ENOXAPARIN SODIUM 60 MG/0.6ML ~~LOC~~ SOLN
60.0000 mg | SUBCUTANEOUS | Status: DC
  Administered 2020-09-21 – 2020-09-29 (×9): 60 mg via SUBCUTANEOUS
  Filled 2020-09-21 (×9): qty 0.6

## 2020-09-21 MED ORDER — SODIUM CHLORIDE 0.9 % IV SOLN
100.0000 mg | Freq: Every day | INTRAVENOUS | Status: AC
  Administered 2020-09-22 – 2020-09-25 (×4): 100 mg via INTRAVENOUS
  Filled 2020-09-21 (×5): qty 20

## 2020-09-21 MED ORDER — DEXAMETHASONE SODIUM PHOSPHATE 10 MG/ML IJ SOLN
10.0000 mg | Freq: Once | INTRAMUSCULAR | Status: AC
  Administered 2020-09-21: 10 mg via INTRAVENOUS
  Filled 2020-09-21: qty 1

## 2020-09-21 NOTE — Progress Notes (Signed)
PHARMACIST - PHYSICIAN COMMUNICATION  CONCERNING:  Enoxaparin (Lovenox) for DVT Prophylaxis     DESCRIPTION: Patient was prescribed enoxaprin 40mg  q24 hours for VTE prophylaxis.   Filed Weights   09/21/20 0454  Weight: 127 kg (280 lb)    Body mass index is 36.94 kg/m.  Estimated Creatinine Clearance: 108.4 mL/min (by C-G formula based on SCr of 1.1 mg/dL).   Based on [facility] policy patient is candidate for enoxaparin 0.5mg /kg TBW SQ every 24 hours based on BMI being >30.   RECOMMENDATION: Pharmacy has adjusted enoxaparin dose per [facility] policy.  Patient is now receiving enoxaparin 60 mg every 24 hours    [facility], PharmD Clinical Pharmacist  09/21/2020 1:31 PM

## 2020-09-21 NOTE — Consult Note (Signed)
Remdesivir - Pharmacy Brief Note   O:  ALT: 61 CXR: Interval development of bilateral pulmonary infiltrates, likely Infectious. Progressive pulmonary hypoinflation. SpO2: 90% on 2L [place]   A/P:  Remdesivir 200 mg IVPB once followed by 100 mg IVPB daily x 4 days.   Martyn Malay, [facility] 09/21/2020 12:30 PM

## 2020-09-21 NOTE — ED Notes (Signed)
Pt reports covid + with uncontrollable coughing since last Thursday. Speaking in complete sentences. 96% on 2L [place] RR even and unlabored.  Denies CP Endorses mild shob  Alert and oriented

## 2020-09-21 NOTE — ED Triage Notes (Signed)
Pt to triage via w/c with no distress noted; st dx with COVID last Thursday and placed on antibiotic for pneumonia; c/o persistent nonprod cough & [facility]

## 2020-09-21 NOTE — ED Provider Notes (Signed)
[facility] Emergency Department Provider Note   ____________________________________________   Event Date/Time   First MD Initiated Contact with Patient 09/21/20 1021     (approximate)  I have reviewed the triage vital signs and the nursing notes.   HISTORY  Chief Complaint Shortness of Breath    HPI Jack Barajas is a 55 y.o. male with past medical history of hypertension who presents to the ED complaining of cough and shortness of breath.  Patient reports that he tested positive for COVID-19 at [facility] urgent care on December 30.  Since then, he was also diagnosed with possible bacterial pneumonia by his PCP and started on an unknown antibiotic.  He has felt worse since then with frequent nonproductive cough and worsening difficulty breathing, especially at night.  He has been managing fevers with Tylenol and ibuprofen, denies any chest pain.  He is unvaccinated against COVID-19.        Past Medical History:  Diagnosis Date  . Hypertension     There are no problems to display for this patient.   Past Surgical History:  Procedure Laterality Date  . TOE SURGERY    . WRIST SURGERY      Prior to Admission medications   Medication Sig Start Date End Date Taking? Authorizing Provider  doxycycline (VIBRAMYCIN) 100 MG capsule Take 1 capsule (100 mg total) by mouth 2 (two) times daily for 7 days. 09/15/20 09/22/20  Eusebio Friendly B, PA-C  lovastatin (MEVACOR) 10 MG tablet Take 10 mg by mouth daily. 08/28/20   [provider]  metFORMIN (GLUCOPHAGE-XR) 500 MG 24 hr tablet Take by mouth. 02/23/20 02/22/21  [provider]  promethazine-dextromethorphan (PROMETHAZINE-DM) 6.25-15 MG/5ML syrup Take 5 mLs by mouth at bedtime as needed for cough. 09/15/20   Shirlee Latch, PA-C  albuterol (PROVENTIL HFA;VENTOLIN HFA) 108 (90 Base) MCG/ACT inhaler Inhale 2 puffs into the lungs every 4 (four) hours as needed. 03/21/18 09/15/20  Renford Dills, NP     Allergies Patient has no known allergies.  Family History  Problem Relation Age of Onset  . Hypertension Mother   . Cancer Father        bone    Social History Social History   Tobacco Use  . Smoking status: Never Smoker  . Smokeless tobacco: Never Used  Vaping Use  . Vaping Use: Never used  Substance Use Topics  . Alcohol use: Yes    Comment: occassionally  . Drug use: No    Review of Systems  Constitutional: No fever/chills Eyes: No visual changes. ENT: No sore throat. Cardiovascular: Denies chest pain. Respiratory: Positive for cough and shortness of breath. Gastrointestinal: No abdominal pain.  No nausea, no vomiting.  No diarrhea.  No constipation. Genitourinary: Negative for dysuria. Musculoskeletal: Negative for back pain. Skin: Negative for rash. Neurological: Negative for headaches, focal weakness or numbness.  ____________________________________________   PHYSICAL EXAM:  VITAL SIGNS: ED Triage Vitals [09/21/20 0454]  Enc Vitals Group     BP (!) 127/91     Pulse Rate (!) 110     Resp (!) 24     Temp 100.2 F (37.9 C)     Temp Source Oral     SpO2 94 %     Weight 280 lb (127 kg)     Height 6\' 1"  (1.854 m)     Head Circumference      Peak Flow      Pain Score 0     Pain  Loc      Pain Edu?      Excl. in [place]?     Constitutional: Alert and oriented. Eyes: Conjunctivae are normal. Head: Atraumatic. Nose: No congestion/rhinnorhea. Mouth/Throat: Mucous membranes are moist. Neck: Normal ROM Cardiovascular: Normal rate, regular rhythm. Grossly normal heart sounds. Respiratory: Tachypneic with increased respiratory effort.  No retractions. Lungs CTAB. Gastrointestinal: Soft and nontender. No distention. Genitourinary: deferred Musculoskeletal: No lower extremity tenderness nor edema. Neurologic:  Normal speech and language. No gross focal neurologic deficits are appreciated. Skin:  Skin is warm, dry and intact. No rash noted. Psychiatric:  Mood and affect are normal. Speech and behavior are normal.  ____________________________________________   LABS (all labs ordered are listed, but only abnormal results are displayed)  Labs Reviewed  CBC WITH DIFFERENTIAL/PLATELET - Abnormal; Notable for the following components:      Result Value   WBC 12.9 (*)    Neutro Abs 11.1 (*)    Abs Immature Granulocytes 0.09 (*)    All other components within normal limits  COMPREHENSIVE METABOLIC PANEL - Abnormal; Notable for the following components:   Sodium 133 (*)    Chloride 94 (*)    Glucose, Bld 193 (*)    AST 105 (*)    ALT 61 (*)    All other components within normal limits  TROPONIN I (HIGH SENSITIVITY) - Abnormal; Notable for the following components:   Troponin I (High Sensitivity) 25 (*)    All other components within normal limits  TROPONIN I (HIGH SENSITIVITY) - Abnormal; Notable for the following components:   Troponin I (High Sensitivity) 18 (*)    All other components within normal limits  PROCALCITONIN  FERRITIN  C-REACTIVE PROTEIN  D-DIMER, QUANTITATIVE (NOT AT [facility])   ____________________________________________  EKG  ED ECG REPORT I, Blake Divine, the attending physician, personally viewed and interpreted this ECG.   Date: 09/21/2020  EKG Time: 5:04  Rate: 110  Rhythm: sinus tachycardia  Axis: Normal  Intervals:none  ST&T Change: None   PROCEDURES  Procedure(s) performed (including Critical Care):  Procedures   ____________________________________________   INITIAL IMPRESSION / ASSESSMENT AND PLAN / ED COURSE       55 year old male with past medical history of hypertension presents to the ED with increasing cough and difficulty breathing after testing positive for COVID-19 6 days ago.  O2 sats hovered around 90 to 91% on room air, patient noted to be significantly tachypneic with any exertion and he was placed on 2 L nasal cannula.  Chest x-ray reviewed by me and shows multifocal  infiltrate consistent with viral pneumonia.  He was on antibiotics per his PCP but we will hold off on additional antibiotics at this time unless his procalcitonin is elevated.  Troponin very mildly elevated but stable on recheck, low suspicion for ACS or PE.  We will treat patient with IV steroids and discuss with hospitalist for admission.      ____________________________________________   FINAL CLINICAL IMPRESSION(S) / ED DIAGNOSES  Final diagnoses:  Pneumonia due to COVID-19 virus     ED Discharge Orders    None       Note:  This document was prepared using Dragon voice recognition software and may include unintentional dictation errors.   Blake Divine, MD 09/21/20 1220

## 2020-09-21 NOTE — H&P (Signed)
History and Physical    Jack Barajas GEX:[id number] DOB: Sep 07, 1966 DOA: 09/21/2020  PCP: Patient, No Pcp Per  Patient coming from: home   Chief Complaint: shortness of breath  HPI: Jack Barajas is a 55 y.o. male with medical history significant for obesity, htn, dm, unvaccinated, who presents with the above.  Symptoms began 12/28. Tested positive for covid 12/30. Symptoms have been cough and progressively worsening shortness of breath. No n/v/d. No chest pain except with heavy coughing. No lower extremity swelling. outpt provider treated for possible pneumonia with oral doxycycline but pt has not noted improvement with that.   ED Course:   Oxygen 90-91 on room air, tachypnic at baseline and significant tachypnea with ambulation. Dexamethasone and remdesivir given, labs ordered. cxr showed bilateral infiltrates.   Review of Systems: As per HPI otherwise 10 point review of systems negative.    Past Medical History:  Diagnosis Date  . Hypertension     Past Surgical History:  Procedure Laterality Date  . TOE SURGERY    . WRIST SURGERY       reports that he has never smoked. He has never used smokeless tobacco. He reports current alcohol use. He reports that he does not use drugs.  No Known Allergies  Family History  Problem Relation Age of Onset  . Hypertension Mother   . Cancer Father        bone    Prior to Admission medications   Medication Sig Start Date End Date Taking? Authorizing Provider  doxycycline (VIBRAMYCIN) 100 MG capsule Take 1 capsule (100 mg total) by mouth 2 (two) times daily for 7 days. 09/15/20 09/22/20  Eusebio Friendly B, PA-C  lovastatin (MEVACOR) 10 MG tablet Take 10 mg by mouth daily. 08/28/20   [provider]  metFORMIN (GLUCOPHAGE-XR) 500 MG 24 hr tablet Take by mouth. 02/23/20 02/22/21  [provider]  promethazine-dextromethorphan (PROMETHAZINE-DM) 6.25-15 MG/5ML syrup Take 5 mLs by mouth at bedtime as needed for cough.  09/15/20   Shirlee Latch, PA-C  albuterol (PROVENTIL HFA;VENTOLIN HFA) 108 (90 Base) MCG/ACT inhaler Inhale 2 puffs into the lungs every 4 (four) hours as needed. 03/21/18 09/15/20  Renford Dills, NP    Physical Exam: Vitals:   09/21/20 1130 09/21/20 1200 09/21/20 1208 09/21/20 1230  BP: 128/82 123/83  129/85  Pulse: 95 92 95 98  Resp:  18    Temp:      TempSrc:      SpO2: 95% 90% 92% 91%  Weight:      Height:        Constitutional: No acute distress Head: Atraumatic Eyes: Conjunctiva clear ENM: Moist mucous membranes. Normal dentition.  Neck: Supple Respiratory: scattered rhonchi Cardiovascular: Regular rate and rhythm. No murmurs/rubs/gallops. Abdomen: obese, Non-tender, non-distended. No masses. No rebound or guarding. Positive bowel sounds. Musculoskeletal: No joint deformity upper and lower extremities. Normal ROM, no contractures. Normal muscle tone.  Skin: No rashes, lesions, or ulcers.  Extremities: No peripheral edema. Palpable peripheral pulses. Neurologic: Alert, moving all 4 extremities. Psychiatric: Normal insight and judgement.   Labs on Admission: I have personally reviewed following labs and imaging studies  CBC: Recent Labs  Lab 09/21/20 0502  WBC 12.9*  NEUTROABS 11.1*  HGB 13.5  HCT 42.2  MCV 90.2  PLT 242   Basic Metabolic Panel: Recent Labs  Lab 09/21/20 0502  NA 133*  K 3.9  CL 94*  CO2 27  GLUCOSE 193*  BUN 16  CREATININE 1.10  CALCIUM 9.0   GFR: Estimated Creatinine Clearance: 108.4 mL/min (by C-G formula based on SCr of 1.1 mg/dL). Liver Function Tests: Recent Labs  Lab 09/21/20 0502  AST 105*  ALT 61*  ALKPHOS 54  BILITOT 0.9  PROT 7.7  ALBUMIN 3.8   No results for input(s): LIPASE, AMYLASE in the last 168 hours. No results for input(s): AMMONIA in the last 168 hours. Coagulation Profile: No results for input(s): INR, PROTIME in the last 168 hours. Cardiac Enzymes: No results for input(s): CKTOTAL, CKMB,  CKMBINDEX, TROPONINI in the last 168 hours. BNP (last 3 results) No results for input(s): PROBNP in the last 8760 hours. HbA1C: No results for input(s): HGBA1C in the last 72 hours. CBG: No results for input(s): GLUCAP in the last 168 hours. Lipid Profile: No results for input(s): CHOL, HDL, LDLCALC, TRIG, CHOLHDL, LDLDIRECT in the last 72 hours. Thyroid Function Tests: No results for input(s): TSH, T4TOTAL, FREET4, T3FREE, THYROIDAB in the last 72 hours. Anemia Panel: Recent Labs    09/21/20 1135  FERRITIN 481*   Urine analysis: No results found for: COLORURINE, APPEARANCEUR, LABSPEC, PHURINE, GLUCOSEU, HGBUR, BILIRUBINUR, KETONESUR, PROTEINUR, UROBILINOGEN, NITRITE, LEUKOCYTESUR  Radiological Exams on Admission: DG Chest 2 View  Result Date: 09/21/2020 CLINICAL DATA:  Dyspnea EXAM: CHEST - 2 VIEW COMPARISON:  09/15/2020 FINDINGS: Lung volumes are small and pulmonary insufflation has diminished since prior examination. Superimposed bilateral mid and lower lung zone patchy pulmonary infiltrates have developed, likely infectious in etiology. No pneumothorax or pleural effusion. Cardiac size within normal limits. No acute bone abnormality. IMPRESSION: Interval development of bilateral pulmonary infiltrates, likely infectious. Progressive pulmonary hypoinflation. Electronically Signed   By: Fidela Salisbury MD   On: 09/21/2020 05:43    EKG: Independently reviewed. Sinus tachycardia  Assessment/Plan Active Problems:   Pneumonia due to COVID-19 virus   T2DM (type 2 diabetes mellitus) ([place])   Obesity    # covid pneumonia With shortness of breath at baseline and significant dyspnea on exertion. Borderline hypoxia o2 90 on room air. Unvaccinated, risk factors include morbid obesity, htn, and dm - f/u inflammatory markers - cont remdesivir, dexamethasone - [place] O2, keep o2 above 94 - pulmonary toilet - daily inflammatory markers  # HTN Here bp wnl, not on home meds - hold home  statin  # t2dm a1c 22 February 2020. Here glucose mildly elevated - hold home metformin, start SSI  # transaminitis - monitor  DVT prophylaxis: lovenox Code Status: full  Family Communication: wife updated telephonically 1/5  Consults called: none    Status is: Inpatient  Remains inpatient appropriate because:Inpatient level of care appropriate due to severity of illness   Dispo: The patient is from: Home              Anticipated d/c is to: Home              Anticipated d/c date is: 2-4 days              Patient currently is not medically stable to d/c.     Desma Maxim MD [facility] Pager [phone]  If 7PM-7AM, please contact night-coverage [email or web address] Password [email or web address]  09/21/2020, 1:26 PM

## 2020-09-22 DIAGNOSIS — J9601 Acute respiratory failure with hypoxia: Secondary | ICD-10-CM | POA: Diagnosis not present

## 2020-09-22 DIAGNOSIS — U071 COVID-19: Secondary | ICD-10-CM | POA: Diagnosis not present

## 2020-09-22 DIAGNOSIS — J1282 Pneumonia due to coronavirus disease 2019: Secondary | ICD-10-CM | POA: Diagnosis not present

## 2020-09-22 LAB — COMPREHENSIVE METABOLIC PANEL
ALT: 60 U/L — ABNORMAL HIGH (ref 0–44)
AST: 87 U/L — ABNORMAL HIGH (ref 15–41)
Albumin: 3.4 g/dL — ABNORMAL LOW (ref 3.5–5.0)
Alkaline Phosphatase: 47 U/L (ref 38–126)
Anion gap: 12 (ref 5–15)
BUN: 18 mg/dL (ref 6–20)
CO2: 27 mmol/L (ref 22–32)
Calcium: 9.1 mg/dL (ref 8.9–10.3)
Chloride: 93 mmol/L — ABNORMAL LOW (ref 98–111)
Creatinine, Ser: 0.75 mg/dL (ref 0.61–1.24)
GFR, Estimated: >60 mL/min (ref >60)
Glucose, Bld: 159 mg/dL — ABNORMAL HIGH (ref 70–99)
Potassium: 4.4 mmol/L (ref 3.5–5.1)
Sodium: 132 mmol/L — ABNORMAL LOW (ref 135–145)
Total Bilirubin: 0.7 mg/dL (ref 0.3–1.2)
Total Protein: 7.4 g/dL (ref 6.5–8.1)

## 2020-09-22 LAB — CBC WITH DIFFERENTIAL/PLATELET
Abs Immature Granulocytes: 0.11 10*3/uL — ABNORMAL HIGH (ref 0.00–0.07)
Basophils Absolute: 0 10*3/uL (ref 0.0–0.1)
Basophils Relative: 0 %
Eosinophils Absolute: 0 10*3/uL (ref 0.0–0.5)
Eosinophils Relative: 0 %
HCT: 40.3 % (ref 39.0–52.0)
Hemoglobin: 12.8 g/dL — ABNORMAL LOW (ref 13.0–17.0)
Immature Granulocytes: 1 %
Lymphocytes Relative: 11 %
Lymphs Abs: 1.2 10*3/uL (ref 0.7–4.0)
MCH: 28.4 pg (ref 26.0–34.0)
MCHC: 31.8 g/dL (ref 30.0–36.0)
MCV: 89.6 fL (ref 80.0–100.0)
Monocytes Absolute: 0.7 10*3/uL (ref 0.1–1.0)
Monocytes Relative: 7 %
Neutro Abs: 8.9 10*3/uL — ABNORMAL HIGH (ref 1.7–7.7)
Neutrophils Relative %: 81 %
Platelets: 258 10*3/uL (ref 150–400)
RBC: 4.5 MIL/uL (ref 4.22–5.81)
RDW: 13.3 % (ref 11.5–15.5)
WBC: 10.9 10*3/uL — ABNORMAL HIGH (ref 4.0–10.5)
nRBC: 0 % (ref 0.0–0.2)

## 2020-09-22 LAB — PHOSPHORUS: Phosphorus: 3.2 mg/dL (ref 2.5–4.6)

## 2020-09-22 LAB — CBG MONITORING, ED
Glucose-Capillary: 165 mg/dL — ABNORMAL HIGH (ref 70–99)
Glucose-Capillary: 220 mg/dL — ABNORMAL HIGH (ref 70–99)
Glucose-Capillary: 247 mg/dL — ABNORMAL HIGH (ref 70–99)

## 2020-09-22 LAB — C-REACTIVE PROTEIN: CRP: 18.4 mg/dL — ABNORMAL HIGH (ref <1.0)

## 2020-09-22 LAB — D-DIMER, QUANTITATIVE: D-Dimer, Quant: 0.9 ug/mL-FEU — ABNORMAL HIGH (ref 0.00–0.50)

## 2020-09-22 LAB — HIV ANTIBODY (ROUTINE TESTING W REFLEX): HIV Screen 4th Generation wRfx: NONREACTIVE

## 2020-09-22 LAB — MAGNESIUM: Magnesium: 2.2 mg/dL (ref 1.7–2.4)

## 2020-09-22 LAB — FERRITIN: Ferritin: 512 ng/mL — ABNORMAL HIGH (ref 24–336)

## 2020-09-22 MED ORDER — METHYLPREDNISOLONE SODIUM SUCC 125 MG IJ SOLR
60.0000 mg | Freq: Two times a day (BID) | INTRAMUSCULAR | Status: DC
  Administered 2020-09-22 – 2020-10-01 (×19): 60 mg via INTRAVENOUS
  Filled 2020-09-22 (×19): qty 2

## 2020-09-22 MED ORDER — OXYMETAZOLINE HCL 0.05 % NA SOLN
1.0000 | Freq: Two times a day (BID) | NASAL | Status: DC
  Administered 2020-09-22 – 2020-09-27 (×12): 1 via NASAL
  Filled 2020-09-22: qty 15
  Filled 2020-09-22: qty 30

## 2020-09-22 MED ORDER — FLUTICASONE FUROATE-VILANTEROL 100-25 MCG/INH IN AEPB
1.0000 | INHALATION_SPRAY | Freq: Every day | RESPIRATORY_TRACT | Status: DC
  Administered 2020-09-23 – 2020-10-01 (×9): 1 via RESPIRATORY_TRACT
  Filled 2020-09-22 (×2): qty 28

## 2020-09-22 MED ORDER — BARICITINIB 2 MG PO TABS
4.0000 mg | ORAL_TABLET | Freq: Every day | ORAL | Status: DC
  Administered 2020-09-22 – 2020-10-01 (×10): 4 mg via ORAL
  Filled 2020-09-22 (×11): qty 2

## 2020-09-22 MED ORDER — AYR SALINE NASAL NA GEL
1.0000 "application " | Freq: Four times a day (QID) | NASAL | Status: DC
  Administered 2020-09-22 – 2020-09-25 (×14): 1 via NASAL
  Filled 2020-09-22: qty 14.1

## 2020-09-22 NOTE — ED Notes (Signed)
  Patient sleeping and dropping SPO2 level into mid 80s.  Asked patient if he has ever been diagnosed with sleep apnea and he states he was told he needed to wear a machine a night.  Patient O2 was increased on [place] and then switched to mask.  Patient kept dropping SPO2 into 80s so patient was placed on NRB mask.   Patient keeping SPO2 levels in mid 90s on NRB.    At baseline while awake, patient was tolerating 2L [place] well and maintaining adequate SPO2 levels.

## 2020-09-22 NOTE — ED Notes (Signed)
Pt lying awake in bed watching tv-- GCS 15 -- denies active pain (states did have epigastric pain earlier but same has since resolved) -- states cough/sob much improved since arrival -- no coughing noted at this time; RR even and unlabored on 11L via HFNC.  Pt placed on cardiac monitor at this time -- NSR HR 91.  Abdomen round, obese and soft- pt denies n/v/d.  Skin warm dry and intact.  20G to L AC; dressing dry and intact- site free of complications.  Will monitor for acute changes and maintain plan of care.

## 2020-09-22 NOTE — Plan of Care (Signed)

## 2020-09-22 NOTE — Progress Notes (Signed)
Jack Barajas, Jack DOA: 09/21/2020 PCP: [facility]   LOS: 1 day   Brief Narrative / Interim history: 55 year old male with Barajas, Jack Barajas, Jack Barajas, Jack Barajas, Jack Barajas, Jack he actually tested positive for Covid on 12/30.  Due to worsening of his symptoms Jack progressive Jack of breath he Jack into the hospital Jack was admitted on 1/5.  Chest x-ray on admission showed bilateral infiltrates  Subjective / 24h Interval events: Still feeling short of breath this morning, has not been out of bed.  Denies any chest pain, no abdominal pain, does have some diarrhea  Assessment & Plan:  Principal Problem Acute Hypoxic Respiratory Failure due to Covid-19 Viral Illness -I found him on a nonrebreather this morning, put him on nasal cannula at 10 L, satting in the upper 80s-low 90s at rest.  Patient started on steroids, Remdesivir, continue.  Added baricitinib this morning after discussing risk/benefits with patient -Continue incentive spirometry, flutter valve, proning as able.  Monitor inflammatory markers.   COVID-19 Labs  Recent Labs    09/21/20 1135 09/22/20 0509  DDIMER 0.57* 0.90*  FERRITIN 481* 512*  CRP 16.8* 18.4*    Lab Results  Component Value Date   SARSCOV2NAA POSITIVE (A) 09/15/2020    Active Problems Type Barajas diabetes mellitus -A1c 7 last year, glucose mildly elevated.  Continue sliding scale.  CBG (last 3)  Recent Labs    09/21/20 2205 09/22/20 0805 09/22/20 1236  GLUCAP 206* 165* 220*   Elevated LFTs -Likely due to Covid infection, slightly better this morning, monitor while on Remdesivir.   Scheduled Meds: . baricitinib  4 mg Oral Daily  . enoxaparin (LOVENOX) injection  60 mg Subcutaneous Q24H  . insulin aspart  0-15 Units Subcutaneous TID WC  . insulin aspart  0-5 Units Subcutaneous QHS  . methylPREDNISolone (SOLU-MEDROL)  injection  60 mg Intravenous Q12H  . oxymetazoline  1 spray Each Nare BID  . saline  1 application Each Nare [id number]   Continuous Infusions: . remdesivir 100 mg in NS 100 mL Stopped (09/22/20 1031)   PRN Meds:.acetaminophen, chlorpheniramine-HYDROcodone, guaiFENesin-dextromethorphan  DVT prophylaxis: Lovenox Code Status: Full code Family Communication: wife Lattie Haw over the phone    Status is: Inpatient  Remains inpatient appropriate because:Inpatient level of care appropriate due to severity of illness   Dispo: The patient is from: Home              Anticipated d/c is to: Home              Anticipated d/c date is: 3 days              Patient currently is not medically stable to d/c.   Consultants:  None   Procedures:  None   Microbiology: None   Antibacterials: None    Objective: Vitals:   09/22/20 0630 09/22/20 0700 09/22/20 0930 09/22/20 1000  BP: 117/77 108/77 110/84 128/76  Pulse: 88 90 84 84  Resp:  20  18  Temp:      TempSrc:      SpO2: 93% 91% 93% 94%  Weight:      Height:        Intake/Output Summary (Last 24 hours) at 09/22/2020 1313 Last data filed at 09/21/2020 1712 Gross per 24 hour  Intake 100 ml  Output 600 ml  Net -500 ml   [employer]  09/21/20 0454  Weight: 127 kg    Examination:  Constitutional: NAD Eyes: no scleral icterus ENMT: Mucous membranes are moist.  Neck: normal, supple Respiratory: bibasilar rhonchi, no wheezing, no crackles. Normal respiratory effort. No accessory muscle use.  Cardiovascular: Regular rate Jack rhythm, no murmurs / rubs / gallops. No LE edema.  Abdomen: non distended, no tenderness. Bowel sounds positive.  Musculoskeletal: no clubbing / cyanosis.  Skin: no rashes Neurologic: CN Barajas-12 grossly intact. Strength 5/5 in all 4.   Data Reviewed: I have independently reviewed following labs Jack imaging studies   CBC: Recent Labs  Lab 09/21/20 0502 09/22/20 0509  WBC 12.9* 10.9*  NEUTROABS 11.1* 8.9*  HGB  13.5 12.8*  HCT 42.Barajas 40.3  MCV 90.Barajas 89.6  PLT 242 258   Basic Metabolic Panel: Recent Labs  Lab 09/21/20 0502 09/22/20 0509  NA 133* 132*  K 3.9 4.4  CL 94* 93*  CO2 Barajas Barajas  GLUCOSE 193* 159*  BUN 16 18  CREATININE 1.10 0.75  CALCIUM 9.0 9.1  MG  --  Barajas.Barajas  PHOS  --  3.Barajas   GFR: Estimated Creatinine Clearance: 149.1 mL/min (by C-G formula based on SCr of 0.75 mg/dL). Liver Function Tests: Recent Labs  Lab 09/21/20 0502 09/22/20 0509  AST 105* 87*  ALT 61* 60*  ALKPHOS 54 47  BILITOT 0.9 0.7  PROT 7.7 7.4  ALBUMIN 3.8 3.4*   No results for input(s): LIPASE, AMYLASE in the last 168 hours. No results for input(s): AMMONIA in the last 168 hours. Coagulation Profile: No results for input(s): INR, PROTIME in the last 168 hours. Cardiac Enzymes: No results for input(s): CKTOTAL, CKMB, CKMBINDEX, TROPONINI in the last 168 hours. BNP (last 3 results) No results for input(s): PROBNP in the last 8760 hours. HbA1C: No results for input(s): HGBA1C in the last 72 hours. CBG: Recent Labs  Lab 09/21/20 1659 09/21/20 2205 09/22/20 0805 09/22/20 1236  GLUCAP 182* 206* 165* 220*   Lipid Profile: No results for input(s): CHOL, HDL, LDLCALC, TRIG, CHOLHDL, LDLDIRECT in the last 72 hours. Thyroid Function Tests: No results for input(s): TSH, T4TOTAL, FREET4, T3FREE, THYROIDAB in the last 72 hours. Anemia Panel: Recent Labs    09/21/20 1135 09/22/20 0509  FERRITIN 481* 512*   Urine analysis: No results found for: COLORURINE, APPEARANCEUR, LABSPEC, PHURINE, GLUCOSEU, HGBUR, BILIRUBINUR, KETONESUR, PROTEINUR, UROBILINOGEN, NITRITE, LEUKOCYTESUR Sepsis Labs: Invalid input(s): PROCALCITONIN, LACTICIDVEN  Recent Results (from the past 240 hour(s))  Resp Panel by RT-PCR (Flu A&B, Covid) Nasopharyngeal Swab     Status: Abnormal   Collection Time: 09/15/20  1:44 PM   Specimen: Nasopharyngeal Swab; Nasopharyngeal(NP) swabs in vial transport medium  Result Value Ref Range Status    SARS Coronavirus Barajas by RT PCR POSITIVE (A) NEGATIVE Final    Comment: RESULT CALLED TO, READ BACK BY Jack VERIFIED WITH: PAULA THOMAS/[id number] Sep 15 2020/SFW (NOTE) SARS-CoV-Barajas target nucleic acids are DETECTED.  The SARS-CoV-Barajas RNA is generally detectable in upper respiratory specimens during the acute phase of infection. Positive results are indicative of the presence of the identified virus, but do not rule out bacterial infection or co-infection with other pathogens not detected by the test. Clinical correlation with patient history Jack other diagnostic information is necessary to determine patient infection status. The expected result is Negative.  Fact Sheet for Patients: BloggerCourse.com  Fact Sheet for Healthcare Providers: SeriousBroker.it  This test is not yet approved or cleared by the [place] Jack  has been authorized for  detection Jack/or diagnosis of SARS-CoV-Barajas by FDA under an Emergency Use Authorization (EUA).  This EUA will remain in effect (meaning this test can  be used) for the duration of  the COVID-19 declaration under Section 564(b)(1) of the Act, 21 U.S.C. section 360bbb-3(b)(1), unless the authorization is terminated or revoked sooner.     Influenza A by PCR NEGATIVE NEGATIVE Final   Influenza B by PCR NEGATIVE NEGATIVE Final    Comment: (NOTE) The Xpert Xpress SARS-CoV-Barajas/FLU/RSV plus assay is intended as an aid in the diagnosis of influenza from Nasopharyngeal swab specimens Jack should not be used as a sole basis for treatment. Nasal washings Jack aspirates are unacceptable for Xpert Xpress SARS-CoV-Barajas/FLU/RSV testing.  Fact Sheet for Patients: EntrepreneurPulse.com.au  Fact Sheet for Healthcare Providers: IncredibleEmployment.be  This test is not yet approved or cleared by the [place] FDA Jack has been authorized for detection Jack/or diagnosis of  SARS-CoV-Barajas by FDA under an Emergency Use Authorization (EUA). This EUA will remain in effect (meaning this test can be used) for the duration of the COVID-19 declaration under Section 564(b)(1) of the Act, 21 U.S.C. section 360bbb-3(b)(1), unless the authorization is terminated or revoked.  Performed at [facility], [address] [place], [place] [address]       Radiology Studies: DG Chest Barajas View  Result Date: 09/21/2020 CLINICAL DATA:  Dyspnea EXAM: CHEST - Barajas VIEW COMPARISON:  09/15/2020 FINDINGS: Lung volumes are small Jack pulmonary insufflation has diminished since prior examination. Superimposed bilateral mid Jack lower lung zone patchy pulmonary infiltrates have developed, likely infectious in etiology. No pneumothorax or pleural effusion. Cardiac size within normal limits. No acute bone abnormality. IMPRESSION: Interval development of bilateral pulmonary infiltrates, likely infectious. Progressive pulmonary hypoinflation. Electronically Signed   By: Fidela Salisbury MD   On: 09/21/2020 05:43    Marzetta Board, MD, PhD [facility]  Between 7 am - 7 pm I am available, please contact me via Amion or Securechat  Between 7 pm - 7 am I am not available, please contact night coverage MD/APP via Amion

## 2020-09-23 DIAGNOSIS — J1282 Pneumonia due to coronavirus disease 2019: Secondary | ICD-10-CM | POA: Diagnosis not present

## 2020-09-23 DIAGNOSIS — U071 COVID-19: Secondary | ICD-10-CM | POA: Diagnosis not present

## 2020-09-23 DIAGNOSIS — J9601 Acute respiratory failure with hypoxia: Secondary | ICD-10-CM | POA: Diagnosis not present

## 2020-09-23 LAB — COMPREHENSIVE METABOLIC PANEL
ALT: 60 U/L — ABNORMAL HIGH (ref 0–44)
AST: 75 U/L — ABNORMAL HIGH (ref 15–41)
Albumin: 3.4 g/dL — ABNORMAL LOW (ref 3.5–5.0)
Alkaline Phosphatase: 56 U/L (ref 38–126)
Anion gap: 9 (ref 5–15)
BUN: 20 mg/dL (ref 6–20)
CO2: 30 mmol/L (ref 22–32)
Calcium: 9.3 mg/dL (ref 8.9–10.3)
Chloride: 97 mmol/L — ABNORMAL LOW (ref 98–111)
Creatinine, Ser: 0.77 mg/dL (ref 0.61–1.24)
GFR, Estimated: >60 mL/min (ref >60)
Glucose, Bld: 243 mg/dL — ABNORMAL HIGH (ref 70–99)
Potassium: 4.9 mmol/L (ref 3.5–5.1)
Sodium: 136 mmol/L (ref 135–145)
Total Bilirubin: 0.5 mg/dL (ref 0.3–1.2)
Total Protein: 7.5 g/dL (ref 6.5–8.1)

## 2020-09-23 LAB — CBC WITH DIFFERENTIAL/PLATELET
Abs Immature Granulocytes: 0.17 10*3/uL — ABNORMAL HIGH (ref 0.00–0.07)
Basophils Absolute: 0 10*3/uL (ref 0.0–0.1)
Basophils Relative: 0 %
Eosinophils Absolute: 0 10*3/uL (ref 0.0–0.5)
Eosinophils Relative: 0 %
HCT: 40.5 % (ref 39.0–52.0)
Hemoglobin: 12.8 g/dL — ABNORMAL LOW (ref 13.0–17.0)
Immature Granulocytes: 2 %
Lymphocytes Relative: 14 %
Lymphs Abs: 1.2 10*3/uL (ref 0.7–4.0)
MCH: 28.6 pg (ref 26.0–34.0)
MCHC: 31.6 g/dL (ref 30.0–36.0)
MCV: 90.6 fL (ref 80.0–100.0)
Monocytes Absolute: 0.5 10*3/uL (ref 0.1–1.0)
Monocytes Relative: 6 %
Neutro Abs: 6.6 10*3/uL (ref 1.7–7.7)
Neutrophils Relative %: 78 %
Platelets: 282 10*3/uL (ref 150–400)
RBC: 4.47 MIL/uL (ref 4.22–5.81)
RDW: 13 % (ref 11.5–15.5)
WBC: 8.5 10*3/uL (ref 4.0–10.5)
nRBC: 0 % (ref 0.0–0.2)

## 2020-09-23 LAB — GLUCOSE, CAPILLARY
Glucose-Capillary: 212 mg/dL — ABNORMAL HIGH (ref 70–99)
Glucose-Capillary: 263 mg/dL — ABNORMAL HIGH (ref 70–99)
Glucose-Capillary: 272 mg/dL — ABNORMAL HIGH (ref 70–99)
Glucose-Capillary: 281 mg/dL — ABNORMAL HIGH (ref 70–99)
Glucose-Capillary: 371 mg/dL — ABNORMAL HIGH (ref 70–99)

## 2020-09-23 LAB — D-DIMER, QUANTITATIVE: D-Dimer, Quant: 0.49 ug/mL-FEU (ref 0.00–0.50)

## 2020-09-23 LAB — C-REACTIVE PROTEIN: CRP: 11.2 mg/dL — ABNORMAL HIGH (ref <1.0)

## 2020-09-23 LAB — PHOSPHORUS: Phosphorus: 3.2 mg/dL (ref 2.5–4.6)

## 2020-09-23 LAB — FERRITIN: Ferritin: 519 ng/mL — ABNORMAL HIGH (ref 24–336)

## 2020-09-23 LAB — MAGNESIUM: Magnesium: 2.5 mg/dL — ABNORMAL HIGH (ref 1.7–2.4)

## 2020-09-23 NOTE — Progress Notes (Signed)
PROGRESS NOTE  Jack Barajas [id number] DOB: 10/05/1965 DOA: 09/21/2020 PCP: [place]   LOS: 2 days   Brief Narrative / Interim history: 55 year old male with obesity, HTN, DM 2, came into the hospital with cough, shortness of breath.  His symptoms initially started on 12/28, and he actually tested positive for Covid on 12/30.  Due to worsening of his symptoms and progressive shortness of breath he came into the hospital and was admitted on 1/5.  Chest x-ray on admission showed bilateral infiltrates  Subjective / 24h Interval events: Sitting edge of the bed, about to eat breakfast.  Sats into the low 80s on 11 L  Assessment & Plan:  Principal Problem Acute Hypoxic Respiratory Failure due to Covid-19 Viral Illness -Remains profoundly hypoxic, 11-15 L high flow -Started on IV steroids, remdesivir, baricitinib, continue. -Continue incentive spirometry, flutter valve, proning as able.  Monitor inflammatory markers.   COVID-19 Labs  Recent Labs    09/21/20 1135 09/22/20 0509 09/23/20 0310  DDIMER 0.57* 0.90*  --   FERRITIN 481* 512* 519*  CRP 16.8* 18.4*  --     Lab Results  Component Value Date   SARSCOV2NAA POSITIVE (A) 09/15/2020    Active Problems Type 2 diabetes mellitus -A1c 7 last year, continue sliding scale, CBGs somewhat acceptable.  Monitor  CBG (last 3)  Recent Labs    09/23/20 0037 09/23/20 0804 09/23/20 1143  GLUCAP 272* 212* 263*   Elevated LFTs -Likely due to Covid infection, slightly better this morning, monitor while on Remdesivir.   Scheduled Meds: . baricitinib  4 mg Oral Daily  . enoxaparin (LOVENOX) injection  60 mg Subcutaneous Q24H  . fluticasone furoate-vilanterol  1 puff Inhalation Daily  . insulin aspart  0-15 Units Subcutaneous TID WC  . insulin aspart  0-5 Units Subcutaneous QHS  . methylPREDNISolone (SOLU-MEDROL) injection  60 mg Intravenous Q12H  . oxymetazoline  1 spray Each Nare BID  . saline  1 application  Each Nare 99991111   Continuous Infusions: . remdesivir 100 mg in NS 100 mL 100 mg (09/23/20 0922)   PRN Meds:.acetaminophen, chlorpheniramine-HYDROcodone, guaiFENesin-dextromethorphan  DVT prophylaxis: Lovenox Code Status: Full code Family Communication: wife Lattie Haw over the phone   Status is: Inpatient  Remains inpatient appropriate because:Inpatient level of care appropriate due to severity of illness  Dispo: The patient is from: Home              Anticipated d/c is to: Home              Anticipated d/c date is: 3 days              Patient currently is not medically stable to d/c.  Consultants:  None   Procedures:  None   Microbiology: None   Antibacterials: None    Objective: Vitals:   09/23/20 0010 09/23/20 0407 09/23/20 0803 09/23/20 1143  BP: 107/71 120/77 133/83 (!) 126/94  Pulse: 73 69 79 89  Resp: 19 19 19 20   Temp: 97.8 F (36.6 C) 97.9 F (36.6 C) 98.4 F (36.9 C) 98.3 F (36.8 C)  TempSrc:   Oral   SpO2: 95% 97% (!) 87% 90%  Weight:      Height:        Intake/Output Summary (Last 24 hours) at 09/23/2020 1207 Last data filed at 09/23/2020 0300 Gross per 24 hour  Intake 100 ml  Output 950 ml  Net -850 ml   Filed Weights   09/21/20 0454  Weight: 127 kg    Examination:  Constitutional: No distress Eyes: No icterus ENMT: mmm Neck: normal, supple Respiratory: bibasilar rhonchi, no wheezing, tachypneic, increased respiratory effort Cardiovascular: Regular rate and rhythm, no murmurs, no peripheral edema Abdomen: Soft, nontender, nondistended, bowel sounds positive Musculoskeletal: no clubbing / cyanosis.  Skin: No rashes seen Neurologic: Nonfocal  Data Reviewed: I have independently reviewed following labs and imaging studies   CBC: Recent Labs  Lab 09/21/20 0502 09/22/20 0509 09/23/20 0310  WBC 12.9* 10.9* 8.5  NEUTROABS 11.1* 8.9* 6.6  HGB 13.5 12.8* 12.8*  HCT 42.2 40.3 40.5  MCV 90.2 89.6 90.6  PLT 242 258 818   Basic Metabolic  Panel: Recent Labs  Lab 09/21/20 0502 09/22/20 0509 09/23/20 0310  NA 133* 132* 136  K 3.9 4.4 4.9  CL 94* 93* 97*  CO2 27 27 30   GLUCOSE 193* 159* 243*  BUN 16 18 20   CREATININE 1.10 0.75 0.77  CALCIUM 9.0 9.1 9.3  MG  --  2.2 2.5*  PHOS  --  3.2 3.2   GFR: Estimated Creatinine Clearance: 149.1 mL/min (by C-G formula based on SCr of 0.77 mg/dL). Liver Function Tests: Recent Labs  Lab 09/21/20 0502 09/22/20 0509 09/23/20 0310  AST 105* 87* 75*  ALT 61* 60* 60*  ALKPHOS 54 47 56  BILITOT 0.9 0.7 0.5  PROT 7.7 7.4 7.5  ALBUMIN 3.8 3.4* 3.4*   No results for input(s): LIPASE, AMYLASE in the last 168 hours. No results for input(s): AMMONIA in the last 168 hours. Coagulation Profile: No results for input(s): INR, PROTIME in the last 168 hours. Cardiac Enzymes: No results for input(s): CKTOTAL, CKMB, CKMBINDEX, TROPONINI in the last 168 hours. BNP (last 3 results) No results for input(s): PROBNP in the last 8760 hours. HbA1C: No results for input(s): HGBA1C in the last 72 hours. CBG: Recent Labs  Lab 09/22/20 1236 09/22/20 1735 09/23/20 0037 09/23/20 0804 09/23/20 1143  GLUCAP 220* 247* 272* 212* 263*   Lipid Profile: No results for input(s): CHOL, HDL, LDLCALC, TRIG, CHOLHDL, LDLDIRECT in the last 72 hours. Thyroid Function Tests: No results for input(s): TSH, T4TOTAL, FREET4, T3FREE, THYROIDAB in the last 72 hours. Anemia Panel: Recent Labs    09/22/20 0509 09/23/20 0310  FERRITIN 512* 519*   Urine analysis: No results found for: COLORURINE, APPEARANCEUR, LABSPEC, PHURINE, GLUCOSEU, HGBUR, BILIRUBINUR, KETONESUR, PROTEINUR, UROBILINOGEN, NITRITE, LEUKOCYTESUR Sepsis Labs: Invalid input(s): PROCALCITONIN, LACTICIDVEN  Recent Results (from the past 240 hour(s))  Resp Panel by RT-PCR (Flu A&B, Covid) Nasopharyngeal Swab     Status: Abnormal   Collection Time: 09/15/20  1:44 PM   Specimen: Nasopharyngeal Swab; Nasopharyngeal(NP) swabs in vial transport  medium  Result Value Ref Range Status   SARS Coronavirus 2 by RT PCR POSITIVE (A) NEGATIVE Final    Comment: RESULT CALLED TO, READ BACK BY AND VERIFIED WITH: PAULA THOMAS/1439PM Sep 15 2020/SFW (NOTE) SARS-CoV-2 target nucleic acids are DETECTED.  The SARS-CoV-2 RNA is generally detectable in upper respiratory specimens during the acute phase of infection. Positive results are indicative of the presence of the identified virus, but do not rule out bacterial infection or co-infection with other pathogens not detected by the test. Clinical correlation with patient history and other diagnostic information is necessary to determine patient infection status. The expected result is Negative.  Fact Sheet for Patients: EntrepreneurPulse.com.au  Fact Sheet for Healthcare Providers: IncredibleEmployment.be  This test is not yet approved or cleared by the [place] FDA and  has  been authorized for detection and/or diagnosis of SARS-CoV-2 by FDA under an Emergency Use Authorization (EUA).  This EUA will remain in effect (meaning this test can  be used) for the duration of  the COVID-19 declaration under Section 564(b)(1) of the Act, 21 U.S.C. section 360bbb-3(b)(1), unless the authorization is terminated or revoked sooner.     Influenza A by PCR NEGATIVE NEGATIVE Final   Influenza B by PCR NEGATIVE NEGATIVE Final    Comment: (NOTE) The Xpert Xpress SARS-CoV-2/FLU/RSV plus assay is intended as an aid in the diagnosis of influenza from Nasopharyngeal swab specimens and should not be used as a sole basis for treatment. Nasal washings and aspirates are unacceptable for Xpert Xpress SARS-CoV-2/FLU/RSV testing.  Fact Sheet for Patients: EntrepreneurPulse.com.au  Fact Sheet for Healthcare Providers: IncredibleEmployment.be  This test is not yet approved or cleared by the [place] FDA and has been authorized  for detection and/or diagnosis of SARS-CoV-2 by FDA under an Emergency Use Authorization (EUA). This EUA will remain in effect (meaning this test can be used) for the duration of the COVID-19 declaration under Section 564(b)(1) of the Act, 21 U.S.C. section 360bbb-3(b)(1), unless the authorization is terminated or revoked.  Performed at [facility], [address] [place], [place] [address]       Radiology Studies: No results found.  Marzetta Board, MD, PhD [facility]  Between 7 am - 7 pm I am available, please contact me via Amion or Securechat  Between 7 pm - 7 am I am not available, please contact night coverage MD/APP via Amion

## 2020-09-23 NOTE — Progress Notes (Signed)
Inpatient Diabetes Program Recommendations  AACE/ADA: New Consensus Statement on Inpatient Glycemic Control   Target Ranges:  Prepandial:   less than 140 mg/dL      Peak postprandial:   less than 180 mg/dL (1-2 hours)      Critically ill patients:  140 - 180 mg/dL   Results for MARISA, HUFSTETLER (MRN [id number]) as of 09/23/2020 07:27  Ref. Range 09/22/2020 08:05 09/22/2020 12:36 09/22/2020 17:35 09/23/2020 00:37  Glucose-Capillary Latest Ref Range: 70 - 99 mg/dL 165 (H) 220 (H) 247 (H) 272 (H)   Review of Glycemic Control  Diabetes history: DM2 Outpatient Diabetes medications: Metformin XR 500 mg QHS Current orders for Inpatient glycemic control: Novolog 0-15 units TID with meals, Novolog 0-5 units QHS; Solumedrol 60 mg Q12H  Inpatient Diabetes Program Recommendations:    Insulin: If steroids are continued as ordered, please consider ordering Levemir 6 units Q24H and Novolog 3 units TID with meals for meal coverage if patient eats at least 50% of meals.  Diet: Please consider discontinuing Regular diet and ordering Carb Modified diet.  Thanks, Barnie Alderman, RN, MSN, CDE Diabetes Coordinator Inpatient Diabetes Program [phone] (Team Pager from 8am to 5pm)

## 2020-09-23 NOTE — Progress Notes (Signed)
Patient is resting in bedside chair. Stated he felt better in chair than in bed. While sitting up, he has been saturating 83-88% with 15L HFNC. Added the NRB 15L, and patient now has begun to saturate 89-90%. He is showing no distress or pain. Call bell in reach.

## 2020-09-23 NOTE — Progress Notes (Signed)
Made doctor aware of patient being on 15L HFNC and NRB 15L. Without oxygen patient levels desaturate to upper 70s to lower 80s. When reapplied and takes a rest break, he will increase to 90-92%. He is currently sitting in bedside chair. Claims he feels better in the chair than in bed. Has call bell in reach.

## 2020-09-24 DIAGNOSIS — J9601 Acute respiratory failure with hypoxia: Secondary | ICD-10-CM | POA: Diagnosis not present

## 2020-09-24 DIAGNOSIS — U071 COVID-19: Secondary | ICD-10-CM | POA: Diagnosis not present

## 2020-09-24 DIAGNOSIS — J1282 Pneumonia due to coronavirus disease 2019: Secondary | ICD-10-CM | POA: Diagnosis not present

## 2020-09-24 LAB — CBC WITH DIFFERENTIAL/PLATELET
Abs Immature Granulocytes: 0.6 10*3/uL — ABNORMAL HIGH (ref 0.00–0.07)
Basophils Absolute: 0.1 10*3/uL (ref 0.0–0.1)
Basophils Relative: 1 %
Eosinophils Absolute: 0.3 10*3/uL (ref 0.0–0.5)
Eosinophils Relative: 2 %
HCT: 42.6 % (ref 39.0–52.0)
Hemoglobin: 14 g/dL (ref 13.0–17.0)
Immature Granulocytes: 5 %
Lymphocytes Relative: 12 %
Lymphs Abs: 1.5 10*3/uL (ref 0.7–4.0)
MCH: 29.7 pg (ref 26.0–34.0)
MCHC: 32.9 g/dL (ref 30.0–36.0)
MCV: 90.4 fL (ref 80.0–100.0)
Monocytes Absolute: 0.9 10*3/uL (ref 0.1–1.0)
Monocytes Relative: 7 %
Neutro Abs: 9.7 10*3/uL — ABNORMAL HIGH (ref 1.7–7.7)
Neutrophils Relative %: 73 %
Platelets: 415 10*3/uL — ABNORMAL HIGH (ref 150–400)
RBC: 4.71 MIL/uL (ref 4.22–5.81)
RDW: 13.1 % (ref 11.5–15.5)
WBC: 13.1 10*3/uL — ABNORMAL HIGH (ref 4.0–10.5)
nRBC: 0 % (ref 0.0–0.2)

## 2020-09-24 LAB — COMPREHENSIVE METABOLIC PANEL
ALT: 62 U/L — ABNORMAL HIGH (ref 0–44)
AST: 52 U/L — ABNORMAL HIGH (ref 15–41)
Albumin: 3.4 g/dL — ABNORMAL LOW (ref 3.5–5.0)
Alkaline Phosphatase: 52 U/L (ref 38–126)
Anion gap: 9 (ref 5–15)
BUN: 26 mg/dL — ABNORMAL HIGH (ref 6–20)
CO2: 30 mmol/L (ref 22–32)
Calcium: 9.7 mg/dL (ref 8.9–10.3)
Chloride: 99 mmol/L (ref 98–111)
Creatinine, Ser: 0.75 mg/dL (ref 0.61–1.24)
GFR, Estimated: >60 mL/min (ref >60)
Glucose, Bld: 255 mg/dL — ABNORMAL HIGH (ref 70–99)
Potassium: 4.7 mmol/L (ref 3.5–5.1)
Sodium: 138 mmol/L (ref 135–145)
Total Bilirubin: 0.6 mg/dL (ref 0.3–1.2)
Total Protein: 7.5 g/dL (ref 6.5–8.1)

## 2020-09-24 LAB — GLUCOSE, CAPILLARY
Glucose-Capillary: 260 mg/dL — ABNORMAL HIGH (ref 70–99)
Glucose-Capillary: 359 mg/dL — ABNORMAL HIGH (ref 70–99)
Glucose-Capillary: 340 mg/dL — ABNORMAL HIGH (ref 70–99)
Glucose-Capillary: 328 mg/dL — ABNORMAL HIGH (ref 70–99)

## 2020-09-24 LAB — C-REACTIVE PROTEIN: CRP: 4.7 mg/dL — ABNORMAL HIGH (ref <1.0)

## 2020-09-24 LAB — PHOSPHORUS: Phosphorus: 3.6 mg/dL (ref 2.5–4.6)

## 2020-09-24 LAB — D-DIMER, QUANTITATIVE: D-Dimer, Quant: 0.43 ug/mL-FEU (ref 0.00–0.50)

## 2020-09-24 LAB — MAGNESIUM: Magnesium: 2.4 mg/dL (ref 1.7–2.4)

## 2020-09-24 LAB — FERRITIN: Ferritin: 456 ng/mL — ABNORMAL HIGH (ref 24–336)

## 2020-09-24 MED ORDER — INSULIN DETEMIR 100 UNIT/ML ~~LOC~~ SOLN
8.0000 [IU] | Freq: Every day | SUBCUTANEOUS | Status: DC
  Administered 2020-09-24: 8 [IU] via SUBCUTANEOUS
  Filled 2020-09-24 (×2): qty 0.08

## 2020-09-24 MED ORDER — INSULIN ASPART 100 UNIT/ML ~~LOC~~ SOLN
3.0000 [IU] | Freq: Three times a day (TID) | SUBCUTANEOUS | Status: DC
  Administered 2020-09-24 – 2020-09-25 (×3): 3 [IU] via SUBCUTANEOUS
  Filled 2020-09-24 (×3): qty 1

## 2020-09-24 NOTE — Progress Notes (Signed)
Patient has been resting in bedside chair, doing leg exercises and standing up at chair to use urinal. He does not have any distress and does not express any shortness of breath. He started off day with 15L NRB and 15L HFNC. Oxygen saturations have been between 88-90%. He has been using incentive spirometer and flutter valve every hour as directed by doctor. He is comfortable. Described to him that we could begin attempting to lower his needs of requirements. Currently lowered HFNC and NRB to 13L. He is tolerating well with continuous pulse ox registering 91%. Call bell in reach. Spoke and updated wife on patient's condition.

## 2020-09-24 NOTE — Progress Notes (Signed)
PROGRESS NOTE  Jack Barajas [id number] DOB: [birth year] DOA: 09/21/2020 PCP: [place]   LOS: 3 days   Brief Narrative / Interim history: 56 year old male with obesity, HTN, DM 2, came into the hospital with cough, shortness of breath.  His symptoms initially started on 12/28, and he actually tested positive for Covid on 12/30.  Due to worsening of his symptoms and progressive shortness of breath he came into the hospital and was admitted on 1/5.  Chest x-ray on admission showed bilateral infiltrates  Subjective / 24h Interval events: He is in the bedside chair, slightly worsening respiratory status and on on 15 L this morning, but subjectively he feels better  Assessment & Plan:  Principal Problem Acute Hypoxic Respiratory Failure due to Covid-19 Viral Illness -Remains hypoxic, perhaps slightly increased oxygen requirement this morning but clinically feels like he is improving -Started on IV steroids, remdesivir, baricitinib, continue. -Continue incentive spirometry, flutter valve, proning as able.  Inflammation overall is coming down   COVID-19 Labs  Recent Labs    09/21/20 1135 09/22/20 0509 09/23/20 0310 09/24/20 0540  DDIMER 0.57* 0.90* 0.49  --   FERRITIN 481* 512* 519* 456*  CRP 16.8* 18.4* 11.2*  --     Lab Results  Component Value Date   SARSCOV2NAA POSITIVE (A) 09/15/2020    Active Problems Type 2 diabetes mellitus -A1c 7 last year, continue sliding scale, CBGs slightly high, will adjust insulin today, add Levemir as well as mealtime insulin  CBG (last 3)  Recent Labs    09/23/20 1553 09/23/20 2130 09/24/20 0805  GLUCAP 371* 281* 260*   Elevated LFTs -Likely due to Covid infection, slightly better this morning, monitor while on Remdesivir.   Scheduled Meds: . baricitinib  4 mg Oral Daily  . enoxaparin (LOVENOX) injection  60 mg Subcutaneous Q24H  . fluticasone furoate-vilanterol  1 puff Inhalation Daily  . insulin aspart  0-15  Units Subcutaneous TID WC  . insulin aspart  0-5 Units Subcutaneous QHS  . methylPREDNISolone (SOLU-MEDROL) injection  60 mg Intravenous Q12H  . oxymetazoline  1 spray Each Nare BID  . saline  1 application Each Nare 99991111   Continuous Infusions: . remdesivir 100 mg in NS 100 mL 100 mg (09/24/20 0913)   PRN Meds:.acetaminophen, chlorpheniramine-HYDROcodone, guaiFENesin-dextromethorphan  DVT prophylaxis: Lovenox Code Status: Full code Family Communication: wife Lattie Haw over the phone   Status is: Inpatient  Remains inpatient appropriate because:Inpatient level of care appropriate due to severity of illness  Dispo: The patient is from: Home              Anticipated d/c is to: Home              Anticipated d/c date is: 3 days              Patient currently is not medically stable to d/c.  Consultants:  None   Procedures:  None   Microbiology: None   Antibacterials: None    Objective: Vitals:   09/23/20 2014 09/24/20 0026 09/24/20 0533 09/24/20 0837  BP: (!) 129/92 (!) 142/97 (!) 140/93 (!) 137/101  Pulse: 94 88 87 84  Resp: 20 18 18 18   Temp: 97.6 F (36.4 C) 98.8 F (37.1 C) 97.6 F (36.4 C) 98.5 F (36.9 C)  TempSrc:  Oral Oral   SpO2: 93% 94% 91% 92%  Weight:      Height:        Intake/Output Summary (Last 24 hours) at 09/24/2020 1016  Last data filed at 09/23/2020 2135 Gross per 24 hour  Intake 100 ml  Output 800 ml  Net -700 ml   Filed Weights   10-05-2020 0454  Weight: 127 kg    Examination:  Constitutional: NAD Eyes: No scleral icterus ENMT: Moist membranes Neck: normal, supple Respiratory: Bibasilar rhonchi, no wheezing, tachypneic Cardiovascular: Regular rate and rhythm, no murmurs, no edema Abdomen: Soft, NT, ND, bowel sounds positive Musculoskeletal: no clubbing / cyanosis.  Skin: No rashes seen Neurologic: No focal deficits  Data Reviewed: I have independently reviewed following labs and imaging studies   CBC: Recent Labs  Lab  05-Oct-2020 0502 09/22/20 0509 09/23/20 0310 09/24/20 0540  WBC 12.9* 10.9* 8.5 13.1*  NEUTROABS 11.1* 8.9* 6.6 9.7*  HGB 13.5 12.8* 12.8* 14.0  HCT 42.2 40.3 40.5 42.6  MCV 90.2 89.6 90.6 90.4  PLT 242 258 282 585*   Basic Metabolic Panel: Recent Labs  Lab 2020/10/05 0502 09/22/20 0509 09/23/20 0310 09/24/20 0540  NA 133* 132* 136 138  K 3.9 4.4 4.9 4.7  CL 94* 93* 97* 99  CO2 27 27 30 30   GLUCOSE 193* 159* 243* 255*  BUN 16 18 20  26*  CREATININE 1.10 0.75 0.77 0.75  CALCIUM 9.0 9.1 9.3 9.7  MG  --  2.2 2.5* 2.4  PHOS  --  3.2 3.2 3.6   GFR: Estimated Creatinine Clearance: 149.1 mL/min (by C-G formula based on SCr of 0.75 mg/dL). Liver Function Tests: Recent Labs  Lab 2020/10/05 0502 09/22/20 0509 09/23/20 0310 09/24/20 0540  AST 105* 87* 75* 52*  ALT 61* 60* 60* 62*  ALKPHOS 54 47 56 52  BILITOT 0.9 0.7 0.5 0.6  PROT 7.7 7.4 7.5 7.5  ALBUMIN 3.8 3.4* 3.4* 3.4*   No results for input(s): LIPASE, AMYLASE in the last 168 hours. No results for input(s): AMMONIA in the last 168 hours. Coagulation Profile: No results for input(s): INR, PROTIME in the last 168 hours. Cardiac Enzymes: No results for input(s): CKTOTAL, CKMB, CKMBINDEX, TROPONINI in the last 168 hours. BNP (last 3 results) No results for input(s): PROBNP in the last 8760 hours. HbA1C: No results for input(s): HGBA1C in the last 72 hours. CBG: Recent Labs  Lab 09/23/20 0804 09/23/20 1143 09/23/20 1553 09/23/20 2130 09/24/20 0805  GLUCAP 212* 263* 371* 281* 260*   Lipid Profile: No results for input(s): CHOL, HDL, LDLCALC, TRIG, CHOLHDL, LDLDIRECT in the last 72 hours. Thyroid Function Tests: No results for input(s): TSH, T4TOTAL, FREET4, T3FREE, THYROIDAB in the last 72 hours. Anemia Panel: Recent Labs    09/23/20 0310 09/24/20 0540  FERRITIN 519* 456*   Urine analysis: No results found for: COLORURINE, APPEARANCEUR, LABSPEC, PHURINE, GLUCOSEU, HGBUR, BILIRUBINUR, KETONESUR, PROTEINUR,  UROBILINOGEN, NITRITE, LEUKOCYTESUR Sepsis Labs: Invalid input(s): PROCALCITONIN, LACTICIDVEN  Recent Results (from the past 240 hour(s))  Resp Panel by RT-PCR (Flu A&B, Covid) Nasopharyngeal Swab     Status: Abnormal   Collection Time: 09/15/20  1:44 PM   Specimen: Nasopharyngeal Swab; Nasopharyngeal(NP) swabs in vial transport medium  Result Value Ref Range Status   SARS Coronavirus 2 by RT PCR POSITIVE (A) NEGATIVE Final    Comment: RESULT CALLED TO, READ BACK BY AND VERIFIED WITH: PAULA THOMAS/1439PM Sep 15 2020/SFW (NOTE) SARS-CoV-2 target nucleic acids are DETECTED.  The SARS-CoV-2 RNA is generally detectable in upper respiratory specimens during the acute phase of infection. Positive results are indicative of the presence of the identified virus, but do not rule out bacterial infection or co-infection with  other pathogens not detected by the test. Clinical correlation with patient history and other diagnostic information is necessary to determine patient infection status. The expected result is Negative.  Fact Sheet for Patients: EntrepreneurPulse.com.au  Fact Sheet for Healthcare Providers: IncredibleEmployment.be  This test is not yet approved or cleared by the [place] FDA and  has been authorized for detection and/or diagnosis of SARS-CoV-2 by FDA under an Emergency Use Authorization (EUA).  This EUA will remain in effect (meaning this test can  be used) for the duration of  the COVID-19 declaration under Section 564(b)(1) of the Act, 21 U.S.C. section 360bbb-3(b)(1), unless the authorization is terminated or revoked sooner.     Influenza A by PCR NEGATIVE NEGATIVE Final   Influenza B by PCR NEGATIVE NEGATIVE Final    Comment: (NOTE) The Xpert Xpress SARS-CoV-2/FLU/RSV plus assay is intended as an aid in the diagnosis of influenza from Nasopharyngeal swab specimens and should not be used as a sole basis for treatment. Nasal  washings and aspirates are unacceptable for Xpert Xpress SARS-CoV-2/FLU/RSV testing.  Fact Sheet for Patients: EntrepreneurPulse.com.au  Fact Sheet for Healthcare Providers: IncredibleEmployment.be  This test is not yet approved or cleared by the [place] FDA and has been authorized for detection and/or diagnosis of SARS-CoV-2 by FDA under an Emergency Use Authorization (EUA). This EUA will remain in effect (meaning this test can be used) for the duration of the COVID-19 declaration under Section 564(b)(1) of the Act, 21 U.S.C. section 360bbb-3(b)(1), unless the authorization is terminated or revoked.  Performed at [facility], [address], [place], [place] [address]       Radiology Studies: No results found.  Marzetta Board, MD, PhD [facility]  Between 7 am - 7 pm I am available, please contact me via Amion or Securechat  Between 7 pm - 7 am I am not available, please contact night coverage MD/APP via Amion

## 2020-09-25 DIAGNOSIS — J1282 Pneumonia due to coronavirus disease 2019: Secondary | ICD-10-CM | POA: Diagnosis not present

## 2020-09-25 DIAGNOSIS — U071 COVID-19: Secondary | ICD-10-CM | POA: Diagnosis not present

## 2020-09-25 DIAGNOSIS — J9601 Acute respiratory failure with hypoxia: Secondary | ICD-10-CM | POA: Diagnosis not present

## 2020-09-25 LAB — CBC WITH DIFFERENTIAL/PLATELET
Abs Immature Granulocytes: 1.19 10*3/uL — ABNORMAL HIGH (ref 0.00–0.07)
Basophils Absolute: 0.1 10*3/uL (ref 0.0–0.1)
Basophils Relative: 1 %
Eosinophils Absolute: 0 10*3/uL (ref 0.0–0.5)
Eosinophils Relative: 0 %
HCT: 43.4 % (ref 39.0–52.0)
Hemoglobin: 13.8 g/dL (ref 13.0–17.0)
Immature Granulocytes: 9 %
Lymphocytes Relative: 10 %
Lymphs Abs: 1.4 10*3/uL (ref 0.7–4.0)
MCH: 28.9 pg (ref 26.0–34.0)
MCHC: 31.8 g/dL (ref 30.0–36.0)
MCV: 90.8 fL (ref 80.0–100.0)
Monocytes Absolute: 1 10*3/uL (ref 0.1–1.0)
Monocytes Relative: 7 %
Neutro Abs: 9.9 10*3/uL — ABNORMAL HIGH (ref 1.7–7.7)
Neutrophils Relative %: 73 %
Platelets: 467 10*3/uL — ABNORMAL HIGH (ref 150–400)
RBC: 4.78 MIL/uL (ref 4.22–5.81)
RDW: 13.2 % (ref 11.5–15.5)
Smear Review: NORMAL
WBC: 13.6 10*3/uL — ABNORMAL HIGH (ref 4.0–10.5)
nRBC: 0 % (ref 0.0–0.2)

## 2020-09-25 LAB — MAGNESIUM: Magnesium: 2.4 mg/dL (ref 1.7–2.4)

## 2020-09-25 LAB — COMPREHENSIVE METABOLIC PANEL
ALT: 87 U/L — ABNORMAL HIGH (ref 0–44)
AST: 64 U/L — ABNORMAL HIGH (ref 15–41)
Albumin: 3.5 g/dL (ref 3.5–5.0)
Alkaline Phosphatase: 53 U/L (ref 38–126)
Anion gap: 10 (ref 5–15)
BUN: 25 mg/dL — ABNORMAL HIGH (ref 6–20)
CO2: 30 mmol/L (ref 22–32)
Calcium: 9.8 mg/dL (ref 8.9–10.3)
Chloride: 100 mmol/L (ref 98–111)
Creatinine, Ser: 0.78 mg/dL (ref 0.61–1.24)
GFR, Estimated: >60 mL/min (ref >60)
Glucose, Bld: 246 mg/dL — ABNORMAL HIGH (ref 70–99)
Potassium: 4.8 mmol/L (ref 3.5–5.1)
Sodium: 140 mmol/L (ref 135–145)
Total Bilirubin: 0.7 mg/dL (ref 0.3–1.2)
Total Protein: 7.4 g/dL (ref 6.5–8.1)

## 2020-09-25 LAB — GLUCOSE, CAPILLARY
Glucose-Capillary: 204 mg/dL — ABNORMAL HIGH (ref 70–99)
Glucose-Capillary: 361 mg/dL — ABNORMAL HIGH (ref 70–99)
Glucose-Capillary: 359 mg/dL — ABNORMAL HIGH (ref 70–99)
Glucose-Capillary: 329 mg/dL — ABNORMAL HIGH (ref 70–99)

## 2020-09-25 LAB — C-REACTIVE PROTEIN: CRP: 2.1 mg/dL — ABNORMAL HIGH (ref <1.0)

## 2020-09-25 LAB — D-DIMER, QUANTITATIVE: D-Dimer, Quant: 0.38 ug/mL-FEU (ref 0.00–0.50)

## 2020-09-25 LAB — FERRITIN: Ferritin: 444 ng/mL — ABNORMAL HIGH (ref 24–336)

## 2020-09-25 LAB — PHOSPHORUS: Phosphorus: 3.4 mg/dL (ref 2.5–4.6)

## 2020-09-25 MED ORDER — BLISTEX MEDICATED EX OINT
TOPICAL_OINTMENT | CUTANEOUS | Status: DC | PRN
  Filled 2020-09-25 (×2): qty 6.3

## 2020-09-25 MED ORDER — SALINE SPRAY 0.65 % NA SOLN
1.0000 | NASAL | Status: DC | PRN
  Filled 2020-09-25: qty 44

## 2020-09-25 MED ORDER — INSULIN ASPART 100 UNIT/ML ~~LOC~~ SOLN
5.0000 [IU] | Freq: Three times a day (TID) | SUBCUTANEOUS | Status: DC
  Administered 2020-09-25 – 2020-09-26 (×3): 5 [IU] via SUBCUTANEOUS
  Filled 2020-09-25 (×3): qty 1

## 2020-09-25 MED ORDER — AYR SALINE NASAL NA GEL
1.0000 "application " | Freq: Four times a day (QID) | NASAL | Status: DC
  Administered 2020-09-25 – 2020-09-30 (×13): 1 via NASAL
  Filled 2020-09-25: qty 14.1

## 2020-09-25 MED ORDER — INSULIN DETEMIR 100 UNIT/ML ~~LOC~~ SOLN
12.0000 [IU] | Freq: Every day | SUBCUTANEOUS | Status: DC
  Administered 2020-09-25: 22:00:00 12 [IU] via SUBCUTANEOUS
  Filled 2020-09-25 (×2): qty 0.12

## 2020-09-25 NOTE — Progress Notes (Signed)
Jack Barajas [id number] DOB: 10/21/1965 DOA: 09/21/2020 PCP: [facility]   LOS: 4 days   Brief Narrative / Interim history: 55 year old male with obesity, HTN, DM 2, came into the hospital with cough, shortness of breath.  His symptoms initially started on 12/28, and he actually tested positive for Covid on 12/30.  Due to worsening of his symptoms and progressive shortness of breath he came into the hospital and was admitted on 1/5.  Chest x-ray on admission showed bilateral infiltrates  Subjective / 24h Interval events: Sitting in the chair this morning, tells me that he feels "great".  He is about to eat his breakfast.  Assessment & Plan:  Principal Problem Acute Hypoxic Respiratory Failure due to Covid-19 Viral Illness -Remains hypoxic, currently on 15 L high flow, satting into the upper 70s when eating breakfast. -Continue steroids, Remdesivir, baricitinib -Continue incentive spirometry, flutter valve, proning as able.  Inflammation overall is coming down   COVID-19 Labs  Recent Labs    09/23/20 0310 09/24/20 0540 09/24/20 0548 09/25/20 0608  DDIMER 0.49  --  0.43  --   FERRITIN 519* 456*  --  444*  CRP 11.2* 4.7*  --   --     Lab Results  Component Value Date   SARSCOV2NAA POSITIVE (A) 09/15/2020    Active Problems Type 2 diabetes mellitus -A1c 7 last year, continue sliding scale, CBGs still high, continue to adjust insulin, increase Lantus along with scheduled mealtime  CBG (last 3)  Recent Labs    09/24/20 1648 09/24/20 2018 09/25/20 0742  GLUCAP 340* 328* 204*   Elevated LFTs -Likely due to Covid infection, slightly better this morning, monitor while on Remdesivir.   Scheduled Meds: . baricitinib  4 mg Oral Daily  . enoxaparin (LOVENOX) injection  60 mg Subcutaneous Q24H  . fluticasone furoate-vilanterol  1 puff Inhalation Daily  . insulin aspart  0-15 Units Subcutaneous TID WC  . insulin aspart  0-5 Units  Subcutaneous QHS  . insulin aspart  3 Units Subcutaneous TID WC  . insulin detemir  8 Units Subcutaneous QHS  . methylPREDNISolone (SOLU-MEDROL) injection  60 mg Intravenous Q12H  . oxymetazoline  1 spray Each Nare BID  . saline  1 application Each Nare [id number]   Continuous Infusions:  PRN Meds:.acetaminophen, chlorpheniramine-HYDROcodone, guaiFENesin-dextromethorphan  DVT prophylaxis: Lovenox Code Status: Full code Family Communication: wife Lattie Haw over the phone   Status is: Inpatient  Remains inpatient appropriate because:Inpatient level of care appropriate due to severity of illness  Dispo: The patient is from: Home              Anticipated d/c is to: Home              Anticipated d/c date is: 3 days              Patient currently is not medically stable to d/c.  Consultants:  None   Procedures:  None   Microbiology: None   Antibacterials: None    Objective: Vitals:   09/24/20 1929 09/24/20 2020 09/25/20 0453 09/25/20 0856  BP:  (!) 143/99 (!) 136/103 (!) 114/92  Pulse:  90 83 80  Resp:  18 18 19   Temp:  97.7 F (36.5 C) 97.7 F (36.5 C) 98.3 F (36.8 C)  TempSrc:  Oral    SpO2: (!) 84% 94% 95% 93%  Weight:      Height:        Intake/Output Summary (Last 24 hours)  at 09/25/2020 1610 Last data filed at 09/25/2020 0700 Gross per 24 hour  Intake 100 ml  Output 700 ml  Net -600 ml   Filed Weights   09/21/20 0454  Weight: 127 kg    Examination:  Constitutional: No distress Eyes: No scleral icterus ENMT: mmm Neck: normal, supple Respiratory: Faint bibasilar rhonchi, no wheezing, tachypneic Cardiovascular: Regular rate and rhythm, no murmurs, no peripheral edema Abdomen: nondistended, positive bowel sounds Musculoskeletal: no clubbing / cyanosis.  Skin: No rash appreciated Neurologic: No focal deficits, ambulatory  Data Reviewed: I have independently reviewed following labs and imaging studies   CBC: Recent Labs  Lab 09/21/20 0502 09/22/20 0509  09/23/20 0310 09/24/20 0540 09/25/20 0608  WBC 12.9* 10.9* 8.5 13.1* 13.6*  NEUTROABS 11.1* 8.9* 6.6 9.7* 9.9*  HGB 13.5 12.8* 12.8* 14.0 13.8  HCT 42.2 40.3 40.5 42.6 43.4  MCV 90.2 89.6 90.6 90.4 90.8  PLT 242 258 282 415* 960*   Basic Metabolic Panel: Recent Labs  Lab 09/21/20 0502 09/22/20 0509 09/23/20 0310 09/24/20 0540 09/25/20 0608  NA 133* 132* 136 138 140  K 3.9 4.4 4.9 4.7 4.8  CL 94* 93* 97* 99 100  CO2 27 27 30 30 30   GLUCOSE 193* 159* 243* 255* 246*  BUN 16 18 20  26* 25*  CREATININE 1.10 0.75 0.77 0.75 0.78  CALCIUM 9.0 9.1 9.3 9.7 9.8  MG  --  2.2 2.5* 2.4 2.4  PHOS  --  3.2 3.2 3.6 3.4   GFR: Estimated Creatinine Clearance: 149.1 mL/min (by C-G formula based on SCr of 0.78 mg/dL). Liver Function Tests: Recent Labs  Lab 09/21/20 0502 09/22/20 0509 09/23/20 0310 09/24/20 0540 09/25/20 0608  AST 105* 87* 75* 52* 64*  ALT 61* 60* 60* 62* 87*  ALKPHOS 54 47 56 52 53  BILITOT 0.9 0.7 0.5 0.6 0.7  PROT 7.7 7.4 7.5 7.5 7.4  ALBUMIN 3.8 3.4* 3.4* 3.4* 3.5   No results for input(s): LIPASE, AMYLASE in the last 168 hours. No results for input(s): AMMONIA in the last 168 hours. Coagulation Profile: No results for input(s): INR, PROTIME in the last 168 hours. Cardiac Enzymes: No results for input(s): CKTOTAL, CKMB, CKMBINDEX, TROPONINI in the last 168 hours. BNP (last 3 results) No results for input(s): PROBNP in the last 8760 hours. HbA1C: No results for input(s): HGBA1C in the last 72 hours. CBG: Recent Labs  Lab 09/24/20 0805 09/24/20 1258 09/24/20 1648 09/24/20 2018 09/25/20 0742  GLUCAP 260* 359* 340* 328* 204*   Lipid Profile: No results for input(s): CHOL, HDL, LDLCALC, TRIG, CHOLHDL, LDLDIRECT in the last 72 hours. Thyroid Function Tests: No results for input(s): TSH, T4TOTAL, FREET4, T3FREE, THYROIDAB in the last 72 hours. Anemia Panel: Recent Labs    09/24/20 0540 09/25/20 0608  FERRITIN 456* 444*   Urine analysis: No results  found for: COLORURINE, APPEARANCEUR, LABSPEC, PHURINE, GLUCOSEU, HGBUR, BILIRUBINUR, KETONESUR, PROTEINUR, UROBILINOGEN, NITRITE, LEUKOCYTESUR Sepsis Labs: Invalid input(s): PROCALCITONIN, LACTICIDVEN  Recent Results (from the past 240 hour(s))  Resp Panel by RT-PCR (Flu A&B, Covid) Nasopharyngeal Swab     Status: Abnormal   Collection Time: 09/15/20  1:44 PM   Specimen: Nasopharyngeal Swab; Nasopharyngeal(NP) swabs in vial transport medium  Result Value Ref Range Status   SARS Coronavirus 2 by RT PCR POSITIVE (A) NEGATIVE Final    Comment: RESULT CALLED TO, READ BACK BY AND VERIFIED WITH: PAULA THOMAS/[id number] Sep 15 2020/SFW (NOTE) SARS-CoV-2 target nucleic acids are DETECTED.  The SARS-CoV-2 RNA is generally  detectable in upper respiratory specimens during the acute phase of infection. Positive results are indicative of the presence of the identified virus, but do not rule out bacterial infection or co-infection with other pathogens not detected by the test. Clinical correlation with patient history and other diagnostic information is necessary to determine patient infection status. The expected result is Negative.  Fact Sheet for Patients: EntrepreneurPulse.com.au  Fact Sheet for Healthcare Providers: IncredibleEmployment.be  This test is not yet approved or cleared by the [place] FDA and  has been authorized for detection and/or diagnosis of SARS-CoV-2 by FDA under an Emergency Use Authorization (EUA).  This EUA will remain in effect (meaning this test can  be used) for the duration of  the COVID-19 declaration under Section 564(b)(1) of the Act, 21 U.S.C. section 360bbb-3(b)(1), unless the authorization is terminated or revoked sooner.     Influenza A by PCR NEGATIVE NEGATIVE Final   Influenza B by PCR NEGATIVE NEGATIVE Final    Comment: (NOTE) The Xpert Xpress SARS-CoV-2/FLU/RSV plus assay is intended as an aid in the diagnosis  of influenza from Nasopharyngeal swab specimens and should not be used as a sole basis for treatment. Nasal washings and aspirates are unacceptable for Xpert Xpress SARS-CoV-2/FLU/RSV testing.  Fact Sheet for Patients: EntrepreneurPulse.com.au  Fact Sheet for Healthcare Providers: IncredibleEmployment.be  This test is not yet approved or cleared by the [place] FDA and has been authorized for detection and/or diagnosis of SARS-CoV-2 by FDA under an Emergency Use Authorization (EUA). This EUA will remain in effect (meaning this test can be used) for the duration of the COVID-19 declaration under Section 564(b)(1) of the Act, 21 U.S.C. section 360bbb-3(b)(1), unless the authorization is terminated or revoked.  Performed at [facility], [address], [place], [place] [address]       Radiology Studies: No results found.  Marzetta Board, MD, PhD [facility]  Between 7 am - 7 pm I am available, please contact me via Amion or Securechat  Between 7 pm - 7 am I am not available, please contact night coverage MD/APP via Amion

## 2020-09-26 DIAGNOSIS — J1282 Pneumonia due to coronavirus disease 2019: Secondary | ICD-10-CM | POA: Diagnosis not present

## 2020-09-26 DIAGNOSIS — U071 COVID-19: Secondary | ICD-10-CM | POA: Diagnosis not present

## 2020-09-26 DIAGNOSIS — J9601 Acute respiratory failure with hypoxia: Secondary | ICD-10-CM | POA: Diagnosis not present

## 2020-09-26 LAB — CBC WITH DIFFERENTIAL/PLATELET
Abs Immature Granulocytes: 1.65 10*3/uL — ABNORMAL HIGH (ref 0.00–0.07)
Basophils Absolute: 0 10*3/uL (ref 0.0–0.1)
Basophils Relative: 0 %
Eosinophils Absolute: 0 10*3/uL (ref 0.0–0.5)
Eosinophils Relative: 0 %
HCT: 42.7 % (ref 39.0–52.0)
Hemoglobin: 13.6 g/dL (ref 13.0–17.0)
Immature Granulocytes: 11 %
Lymphocytes Relative: 8 %
Lymphs Abs: 1.3 10*3/uL (ref 0.7–4.0)
MCH: 28.5 pg (ref 26.0–34.0)
MCHC: 31.9 g/dL (ref 30.0–36.0)
MCV: 89.3 fL (ref 80.0–100.0)
Monocytes Absolute: 0.7 10*3/uL (ref 0.1–1.0)
Monocytes Relative: 5 %
Neutro Abs: 12 10*3/uL — ABNORMAL HIGH (ref 1.7–7.7)
Neutrophils Relative %: 76 %
Platelets: 491 10*3/uL — ABNORMAL HIGH (ref 150–400)
RBC: 4.78 MIL/uL (ref 4.22–5.81)
RDW: 13.2 % (ref 11.5–15.5)
Smear Review: NORMAL
WBC: 15.7 10*3/uL — ABNORMAL HIGH (ref 4.0–10.5)
nRBC: 0 % (ref 0.0–0.2)

## 2020-09-26 LAB — D-DIMER, QUANTITATIVE: D-Dimer, Quant: 0.33 ug/mL-FEU (ref 0.00–0.50)

## 2020-09-26 LAB — COMPREHENSIVE METABOLIC PANEL
ALT: 94 U/L — ABNORMAL HIGH (ref 0–44)
AST: 51 U/L — ABNORMAL HIGH (ref 15–41)
Albumin: 3.4 g/dL — ABNORMAL LOW (ref 3.5–5.0)
Alkaline Phosphatase: 48 U/L (ref 38–126)
Anion gap: 12 (ref 5–15)
BUN: 24 mg/dL — ABNORMAL HIGH (ref 6–20)
CO2: 26 mmol/L (ref 22–32)
Calcium: 9.5 mg/dL (ref 8.9–10.3)
Chloride: 99 mmol/L (ref 98–111)
Creatinine, Ser: 0.66 mg/dL (ref 0.61–1.24)
GFR, Estimated: >60 mL/min (ref >60)
Glucose, Bld: 255 mg/dL — ABNORMAL HIGH (ref 70–99)
Potassium: 5 mmol/L (ref 3.5–5.1)
Sodium: 137 mmol/L (ref 135–145)
Total Bilirubin: 0.7 mg/dL (ref 0.3–1.2)
Total Protein: 7.3 g/dL (ref 6.5–8.1)

## 2020-09-26 LAB — GLUCOSE, CAPILLARY
Glucose-Capillary: 254 mg/dL — ABNORMAL HIGH (ref 70–99)
Glucose-Capillary: 291 mg/dL — ABNORMAL HIGH (ref 70–99)
Glucose-Capillary: 392 mg/dL — ABNORMAL HIGH (ref 70–99)
Glucose-Capillary: 322 mg/dL — ABNORMAL HIGH (ref 70–99)

## 2020-09-26 LAB — C-REACTIVE PROTEIN: CRP: 1.1 mg/dL — ABNORMAL HIGH (ref <1.0)

## 2020-09-26 LAB — PHOSPHORUS: Phosphorus: 3.2 mg/dL (ref 2.5–4.6)

## 2020-09-26 LAB — MAGNESIUM: Magnesium: 2.4 mg/dL (ref 1.7–2.4)

## 2020-09-26 MED ORDER — INSULIN ASPART 100 UNIT/ML ~~LOC~~ SOLN
7.0000 [IU] | Freq: Three times a day (TID) | SUBCUTANEOUS | Status: DC
  Administered 2020-09-26 – 2020-09-28 (×6): 7 [IU] via SUBCUTANEOUS
  Filled 2020-09-26 (×6): qty 1

## 2020-09-26 MED ORDER — INSULIN DETEMIR 100 UNIT/ML ~~LOC~~ SOLN
15.0000 [IU] | Freq: Every day | SUBCUTANEOUS | Status: DC
  Administered 2020-09-26 – 2020-09-27 (×2): 15 [IU] via SUBCUTANEOUS
  Filled 2020-09-26 (×3): qty 0.15

## 2020-09-26 MED ORDER — FUROSEMIDE 10 MG/ML IJ SOLN
40.0000 mg | Freq: Once | INTRAMUSCULAR | Status: AC
  Administered 2020-09-26: 40 mg via INTRAVENOUS
  Filled 2020-09-26: qty 4

## 2020-09-26 NOTE — Progress Notes (Signed)
Gave update to patients wife on patients condition.

## 2020-09-26 NOTE — Progress Notes (Signed)
PROGRESS NOTE  Jack Barajas [id number] DOB: Aug 24, 1966 DOA: 09/21/2020 PCP: [facility]   LOS: 5 days   Brief Narrative / Interim history: 55 year old male with obesity, HTN, DM 2, came into the hospital with cough, shortness of breath.  His symptoms initially started on 12/28, and he actually tested positive for Covid on 12/30.  Due to worsening of his symptoms and progressive shortness of breath he came into the hospital and was admitted on 1/5.  Chest x-ray on admission showed bilateral infiltrates  Subjective / 24h Interval events: Overall feeling good, however desats into the 70s with just walking to the sink and back.  No chest pain, no abdominal pain, no nausea or vomiting  Assessment & Plan:  Principal Problem Acute Hypoxic Respiratory Failure due to Covid-19 Viral Illness -Remains hypoxic, currently on 15 L, very minimal reserves as just talking plummets his sats into the low 80s -Continue steroids, baricitinib, status post 5 days of Remdesivir -Continue incentive spirometry, flutter valve, proning as able.  Inflammation overall is coming down -Lasix today   COVID-19 Labs  Recent Labs    09/24/20 0540 09/24/20 0548 09/25/20 0608  DDIMER  --  0.43 0.38  FERRITIN 456*  --  444*  CRP 4.7*  --  2.1*    Lab Results  Component Value Date   SARSCOV2NAA POSITIVE (A) 09/15/2020    Active Problems Type 2 diabetes mellitus -A1c 7 last year, continue sliding scale, CBGs still high, continue to adjust insulin furthermore  CBG (last 3)  Recent Labs    09/25/20 1607 09/25/20 2106 09/26/20 0756  GLUCAP 359* 329* 254*   Elevated LFTs -Likely due to Covid infection   Scheduled Meds: . baricitinib  4 mg Oral Daily  . enoxaparin (LOVENOX) injection  60 mg Subcutaneous Q24H  . fluticasone furoate-vilanterol  1 puff Inhalation Daily  . insulin aspart  0-15 Units Subcutaneous TID WC  . insulin aspart  0-5 Units Subcutaneous QHS  . insulin aspart  5  Units Subcutaneous TID WC  . insulin detemir  12 Units Subcutaneous QHS  . methylPREDNISolone (SOLU-MEDROL) injection  60 mg Intravenous Q12H  . oxymetazoline  1 spray Each Nare BID  . saline  1 application Each Nare P3I   Continuous Infusions:  PRN Meds:.acetaminophen, chlorpheniramine-HYDROcodone, guaiFENesin-dextromethorphan, lip balm, sodium chloride  DVT prophylaxis: Lovenox Code Status: Full code Family Communication: wife Lattie Haw over the phone   Status is: Inpatient  Remains inpatient appropriate because:Inpatient level of care appropriate due to severity of illness  Dispo: The patient is from: Home              Anticipated d/c is to: Home              Anticipated d/c date is: 3 days              Patient currently is not medically stable to d/c.  Consultants:  None   Procedures:  None   Microbiology: None   Antibacterials: None    Objective: Vitals:   09/25/20 1155 09/25/20 1606 09/25/20 2017 09/26/20 0727  BP: (!) 135/92 124/85 (!) 146/105 (!) 150/77  Pulse: 88 97 86 96  Resp: 20 19 18 16   Temp: 97.7 F (36.5 C) 98.5 F (36.9 C) 97.7 F (36.5 C) 98.9 F (37.2 C)  TempSrc:   Oral   SpO2: 94% 94% 95% 93%  Weight:      Height:        Intake/Output Summary (Last 24  hours) at 09/26/2020 1043 Last data filed at 09/26/2020 0900 Gross per 24 hour  Intake --  Output 975 ml  Net -975 ml   Filed Weights   09/21/20 0454  Weight: 127 kg    Examination:  Constitutional: NAD Eyes: No icterus ENMT: mmm Neck: normal, supple Respiratory: Bibasilar rhonchi, no wheezing Cardiovascular: Regular rate and rhythm, no murmurs, no edema Abdomen: Nondistended, positive bowel sounds Musculoskeletal: no clubbing / cyanosis.  Skin: No rashes appreciated Neurologic: Nonfocal, equal strength  Data Reviewed: I have independently reviewed following labs and imaging studies   CBC: Recent Labs  Lab 09/22/20 0509 09/23/20 0310 09/24/20 0540 09/25/20 0608  09/26/20 0413  WBC 10.9* 8.5 13.1* 13.6* 15.7*  NEUTROABS 8.9* 6.6 9.7* 9.9* 12.0*  HGB 12.8* 12.8* 14.0 13.8 13.6  HCT 40.3 40.5 42.6 43.4 42.7  MCV 89.6 90.6 90.4 90.8 89.3  PLT 258 282 415* 467* 329*   Basic Metabolic Panel: Recent Labs  Lab 09/22/20 0509 09/23/20 0310 09/24/20 0540 09/25/20 0608 09/26/20 0413  NA 132* 136 138 140 137  K 4.4 4.9 4.7 4.8 5.0  CL 93* 97* 99 100 99  CO2 27 30 30 30 26   GLUCOSE 159* 243* 255* 246* 255*  BUN 18 20 26* 25* 24*  CREATININE 0.75 0.77 0.75 0.78 0.66  CALCIUM 9.1 9.3 9.7 9.8 9.5  MG 2.2 2.5* 2.4 2.4 2.4  PHOS 3.2 3.2 3.6 3.4 3.2   GFR: Estimated Creatinine Clearance: 149.1 mL/min (by C-G formula based on SCr of 0.66 mg/dL). Liver Function Tests: Recent Labs  Lab 09/22/20 0509 09/23/20 0310 09/24/20 0540 09/25/20 0608 09/26/20 0413  AST 87* 75* 52* 64* 51*  ALT 60* 60* 62* 87* 94*  ALKPHOS 47 56 52 53 48  BILITOT 0.7 0.5 0.6 0.7 0.7  PROT 7.4 7.5 7.5 7.4 7.3  ALBUMIN 3.4* 3.4* 3.4* 3.5 3.4*   No results for input(s): LIPASE, AMYLASE in the last 168 hours. No results for input(s): AMMONIA in the last 168 hours. Coagulation Profile: No results for input(s): INR, PROTIME in the last 168 hours. Cardiac Enzymes: No results for input(s): CKTOTAL, CKMB, CKMBINDEX, TROPONINI in the last 168 hours. BNP (last 3 results) No results for input(s): PROBNP in the last 8760 hours. HbA1C: No results for input(s): HGBA1C in the last 72 hours. CBG: Recent Labs  Lab 09/25/20 0742 09/25/20 1128 09/25/20 1607 09/25/20 2106 09/26/20 0756  GLUCAP 204* 361* 359* 329* 254*   Lipid Profile: No results for input(s): CHOL, HDL, LDLCALC, TRIG, CHOLHDL, LDLDIRECT in the last 72 hours. Thyroid Function Tests: No results for input(s): TSH, T4TOTAL, FREET4, T3FREE, THYROIDAB in the last 72 hours. Anemia Panel: Recent Labs    09/24/20 0540 09/25/20 0608  FERRITIN 456* 444*   Urine analysis: No results found for: COLORURINE,  APPEARANCEUR, LABSPEC, PHURINE, GLUCOSEU, HGBUR, BILIRUBINUR, KETONESUR, PROTEINUR, UROBILINOGEN, NITRITE, LEUKOCYTESUR Sepsis Labs: Invalid input(s): PROCALCITONIN, LACTICIDVEN  No results found for this or any previous visit (from the past 240 hour(s)).    Radiology Studies: No results found.  Marzetta Board, MD, PhD [facility]  Between 7 am - 7 pm I am available, please contact me via Amion or Securechat  Between 7 pm - 7 am I am not available, please contact night coverage MD/APP via Amion

## 2020-09-27 ENCOUNTER — Inpatient Hospital Stay: Payer: 59

## 2020-09-27 DIAGNOSIS — U071 COVID-19: Secondary | ICD-10-CM | POA: Diagnosis not present

## 2020-09-27 DIAGNOSIS — J1282 Pneumonia due to coronavirus disease 2019: Secondary | ICD-10-CM | POA: Diagnosis not present

## 2020-09-27 DIAGNOSIS — J9601 Acute respiratory failure with hypoxia: Secondary | ICD-10-CM | POA: Diagnosis not present

## 2020-09-27 LAB — COMPREHENSIVE METABOLIC PANEL
ALT: 87 U/L — ABNORMAL HIGH (ref 0–44)
AST: 39 U/L (ref 15–41)
Albumin: 3.5 g/dL (ref 3.5–5.0)
Alkaline Phosphatase: 54 U/L (ref 38–126)
Anion gap: 11 (ref 5–15)
BUN: 25 mg/dL — ABNORMAL HIGH (ref 6–20)
CO2: 29 mmol/L (ref 22–32)
Calcium: 9.6 mg/dL (ref 8.9–10.3)
Chloride: 96 mmol/L — ABNORMAL LOW (ref 98–111)
Creatinine, Ser: 0.9 mg/dL (ref 0.61–1.24)
GFR, Estimated: >60 mL/min (ref >60)
Glucose, Bld: 249 mg/dL — ABNORMAL HIGH (ref 70–99)
Potassium: 5.3 mmol/L — ABNORMAL HIGH (ref 3.5–5.1)
Sodium: 136 mmol/L (ref 135–145)
Total Bilirubin: 0.8 mg/dL (ref 0.3–1.2)
Total Protein: 7.2 g/dL (ref 6.5–8.1)

## 2020-09-27 LAB — GLUCOSE, CAPILLARY
Glucose-Capillary: 230 mg/dL — ABNORMAL HIGH (ref 70–99)
Glucose-Capillary: 341 mg/dL — ABNORMAL HIGH (ref 70–99)
Glucose-Capillary: 430 mg/dL — ABNORMAL HIGH (ref 70–99)
Glucose-Capillary: 375 mg/dL — ABNORMAL HIGH (ref 70–99)

## 2020-09-27 LAB — CBC
HCT: 44.4 % (ref 39.0–52.0)
Hemoglobin: 14.4 g/dL (ref 13.0–17.0)
MCH: 29.3 pg (ref 26.0–34.0)
MCHC: 32.4 g/dL (ref 30.0–36.0)
MCV: 90.2 fL (ref 80.0–100.0)
Platelets: 501 10*3/uL — ABNORMAL HIGH (ref 150–400)
RBC: 4.92 MIL/uL (ref 4.22–5.81)
RDW: 13.2 % (ref 11.5–15.5)
WBC: 18.7 10*3/uL — ABNORMAL HIGH (ref 4.0–10.5)
nRBC: 0 % (ref 0.0–0.2)

## 2020-09-27 LAB — D-DIMER, QUANTITATIVE: D-Dimer, Quant: 0.4 ug/mL-FEU (ref 0.00–0.50)

## 2020-09-27 LAB — C-REACTIVE PROTEIN: CRP: 0.7 mg/dL (ref <1.0)

## 2020-09-27 MED ORDER — FUROSEMIDE 10 MG/ML IJ SOLN
40.0000 mg | Freq: Once | INTRAMUSCULAR | Status: AC
  Administered 2020-09-27: 12:00:00 40 mg via INTRAVENOUS
  Filled 2020-09-27: qty 4

## 2020-09-27 NOTE — Progress Notes (Signed)
PROGRESS NOTE  Jack Barajas [id number] DOB: July 13, 1966 DOA: 09/21/2020 PCP: [facility]   LOS: 6 days   Brief Narrative / Interim history: 55 year old male with obesity, HTN, DM 2, came into the hospital with cough, shortness of breath.  His symptoms initially started on 12/28, and he actually tested positive for Covid on 12/30.  Due to worsening of his symptoms and progressive shortness of breath he came into the hospital and was admitted on 1/5.  Chest x-ray on admission showed bilateral infiltrates  Subjective / 24h Interval events: Overall feeling good, still desaturating into the mid 80s with talking/eating/ambulating.  On 13 L.  No chest pain, no abdominal pain, no nausea or vomiting.  Assessment & Plan:  Principal Problem Acute Hypoxic Respiratory Failure due to Covid-19 Viral Illness -Remains hypoxic, somewhat improved today but still on 13 L, very minimal reserves as just talking and minimal activities drops his sats into the low 80s. -Status post 5 days of remdesivir, continue steroids baricitinib currently. -Continue incentive spirometry, flutter valve, proning as able.  Inflammation overall is coming down -Lasix 1/10, repeat 1/11   COVID-19 Labs  Recent Labs    09/25/20 0608 09/26/20 0413  DDIMER 0.38 0.33  FERRITIN 444*  --   CRP 2.1* 1.1*    Lab Results  Component Value Date   SARSCOV2NAA POSITIVE (A) 09/15/2020    Active Problems Type 2 diabetes mellitus -A1c 7 last year, continue sliding scale, CBGs still high but overall improved compared to yesterday 2 days ago.  Keep on same regimen  CBG (last 3)  Recent Labs    09/26/20 1559 09/26/20 2050 09/27/20 0759  GLUCAP 392* 322* 230*   Elevated LFTs -Likely due to Covid infection   Scheduled Meds: . baricitinib  4 mg Oral Daily  . enoxaparin (LOVENOX) injection  60 mg Subcutaneous Q24H  . fluticasone furoate-vilanterol  1 puff Inhalation Daily  . insulin aspart  0-15 Units  Subcutaneous TID WC  . insulin aspart  0-5 Units Subcutaneous QHS  . insulin aspart  7 Units Subcutaneous TID WC  . insulin detemir  15 Units Subcutaneous QHS  . methylPREDNISolone (SOLU-MEDROL) injection  60 mg Intravenous Q12H  . oxymetazoline  1 spray Each Nare BID  . saline  1 application Each Nare B1Y   Continuous Infusions:  PRN Meds:.acetaminophen, chlorpheniramine-HYDROcodone, guaiFENesin-dextromethorphan, lip balm, sodium chloride  DVT prophylaxis: Lovenox Code Status: Full code Family Communication: wife Lattie Haw over the phone   Status is: Inpatient  Remains inpatient appropriate because:Inpatient level of care appropriate due to severity of illness  Dispo: The patient is from: Home              Anticipated d/c is to: Home              Anticipated d/c date is: 3 days              Patient currently is not medically stable to d/c.  Consultants:  None   Procedures:  None   Microbiology: None   Antibacterials: None    Objective: Vitals:   09/26/20 1946 09/26/20 2350 09/27/20 0503 09/27/20 0752  BP: 130/85 (!) 125/97 (!) 131/91 124/89  Pulse: (!) 108 88 90 97  Resp: 20 18 18 17   Temp: 98 F (36.7 C) 97.8 F (36.6 C) 97.6 F (36.4 C) 97.9 F (36.6 C)  TempSrc:      SpO2: 90% (!) 88% 92% (!) 89%  Weight:      Height:  No intake or output data in the 24 hours ending 09/27/20 1022 Filed Weights   09/21/20 0454  Weight: 127 kg    Examination:  Constitutional: No distress Eyes: No scleral icterus ENMT: mmm Neck: normal, supple Respiratory: Faint bibasilar rhonchi, no wheezing, no crackles, moves air well Cardiovascular: Regular rate and rhythm, no murmurs, no peripheral edema Abdomen: Soft, nontender, nondistended, positive bowel sounds Musculoskeletal: no clubbing / cyanosis.  Skin: No rash appreciated Neurologic: No focal deficits, equal strength, ambulatory  Data Reviewed: I have independently reviewed following labs and imaging studies    CBC: Recent Labs  Lab 10-17-20 0509 09/23/20 0310 09/24/20 0540 09/25/20 0608 09/26/20 0413 09/27/20 0428  WBC 10.9* 8.5 13.1* 13.6* 15.7* 18.7*  NEUTROABS 8.9* 6.6 9.7* 9.9* 12.0*  --   HGB 12.8* 12.8* 14.0 13.8 13.6 14.4  HCT 40.3 40.5 42.6 43.4 42.7 44.4  MCV 89.6 90.6 90.4 90.8 89.3 90.2  PLT 258 282 415* 467* 491* 245*   Basic Metabolic Panel: Recent Labs  Lab 17-Oct-2020 0509 09/23/20 0310 09/24/20 0540 09/25/20 0608 09/26/20 0413 09/27/20 0428  NA 132* 136 138 140 137 136  K 4.4 4.9 4.7 4.8 5.0 5.3*  CL 93* 97* 99 100 99 96*  CO2 27 30 30 30 26 29   GLUCOSE 159* 243* 255* 246* 255* 249*  BUN 18 20 26* 25* 24* 25*  CREATININE 0.75 0.77 0.75 0.78 0.66 0.90  CALCIUM 9.1 9.3 9.7 9.8 9.5 9.6  MG 2.2 2.5* 2.4 2.4 2.4  --   PHOS 3.2 3.2 3.6 3.4 3.2  --    GFR: Estimated Creatinine Clearance: 132.5 mL/min (by C-G formula based on SCr of 0.9 mg/dL). Liver Function Tests: Recent Labs  Lab 09/23/20 0310 09/24/20 0540 09/25/20 0608 09/26/20 0413 09/27/20 0428  AST 75* 52* 64* 51* 39  ALT 60* 62* 87* 94* 87*  ALKPHOS 56 52 53 48 54  BILITOT 0.5 0.6 0.7 0.7 0.8  PROT 7.5 7.5 7.4 7.3 7.2  ALBUMIN 3.4* 3.4* 3.5 3.4* 3.5   No results for input(s): LIPASE, AMYLASE in the last 168 hours. No results for input(s): AMMONIA in the last 168 hours. Coagulation Profile: No results for input(s): INR, PROTIME in the last 168 hours. Cardiac Enzymes: No results for input(s): CKTOTAL, CKMB, CKMBINDEX, TROPONINI in the last 168 hours. BNP (last 3 results) No results for input(s): PROBNP in the last 8760 hours. HbA1C: No results for input(s): HGBA1C in the last 72 hours. CBG: Recent Labs  Lab 09/26/20 0756 09/26/20 1137 09/26/20 1559 09/26/20 2050 09/27/20 0759  GLUCAP 254* 291* 392* 322* 230*   Lipid Profile: No results for input(s): CHOL, HDL, LDLCALC, TRIG, CHOLHDL, LDLDIRECT in the last 72 hours. Thyroid Function Tests: No results for input(s): TSH, T4TOTAL,  FREET4, T3FREE, THYROIDAB in the last 72 hours. Anemia Panel: Recent Labs    09/25/20 0608  FERRITIN 444*   Urine analysis: No results found for: COLORURINE, APPEARANCEUR, LABSPEC, PHURINE, GLUCOSEU, HGBUR, BILIRUBINUR, KETONESUR, PROTEINUR, UROBILINOGEN, NITRITE, LEUKOCYTESUR Sepsis Labs: Invalid input(s): PROCALCITONIN, LACTICIDVEN  No results found for this or any previous visit (from the past 240 hour(s)).    Radiology Studies: No results found.  Marzetta Board, MD, PhD [facility]  Between 7 am - 7 pm I am available, please contact me via Amion or Securechat  Between 7 pm - 7 am I am not available, please contact night coverage MD/APP via Amion

## 2020-09-27 NOTE — Progress Notes (Signed)
Patients CBG was 430 over sliding scale. Message MD, and order to give 18 units.

## 2020-09-28 DIAGNOSIS — U071 COVID-19: Secondary | ICD-10-CM | POA: Diagnosis not present

## 2020-09-28 DIAGNOSIS — J1282 Pneumonia due to coronavirus disease 2019: Secondary | ICD-10-CM | POA: Diagnosis not present

## 2020-09-28 LAB — COMPREHENSIVE METABOLIC PANEL
ALT: 73 U/L — ABNORMAL HIGH (ref 0–44)
AST: 30 U/L (ref 15–41)
Albumin: 3.1 g/dL — ABNORMAL LOW (ref 3.5–5.0)
Alkaline Phosphatase: 48 U/L (ref 38–126)
Anion gap: 9 (ref 5–15)
BUN: 25 mg/dL — ABNORMAL HIGH (ref 6–20)
CO2: 28 mmol/L (ref 22–32)
Calcium: 9.3 mg/dL (ref 8.9–10.3)
Chloride: 100 mmol/L (ref 98–111)
Creatinine, Ser: 0.83 mg/dL (ref 0.61–1.24)
GFR, Estimated: >60 mL/min (ref >60)
Glucose, Bld: 241 mg/dL — ABNORMAL HIGH (ref 70–99)
Potassium: 4.8 mmol/L (ref 3.5–5.1)
Sodium: 137 mmol/L (ref 135–145)
Total Bilirubin: 0.7 mg/dL (ref 0.3–1.2)
Total Protein: 6.5 g/dL (ref 6.5–8.1)

## 2020-09-28 LAB — CBC
HCT: 42.1 % (ref 39.0–52.0)
Hemoglobin: 13.6 g/dL (ref 13.0–17.0)
MCH: 28.7 pg (ref 26.0–34.0)
MCHC: 32.3 g/dL (ref 30.0–36.0)
MCV: 88.8 fL (ref 80.0–100.0)
Platelets: 464 10*3/uL — ABNORMAL HIGH (ref 150–400)
RBC: 4.74 MIL/uL (ref 4.22–5.81)
RDW: 13.1 % (ref 11.5–15.5)
WBC: 15.2 10*3/uL — ABNORMAL HIGH (ref 4.0–10.5)
nRBC: 0 % (ref 0.0–0.2)

## 2020-09-28 LAB — C-REACTIVE PROTEIN: CRP: 0.6 mg/dL (ref <1.0)

## 2020-09-28 LAB — GLUCOSE, CAPILLARY
Glucose-Capillary: 232 mg/dL — ABNORMAL HIGH (ref 70–99)
Glucose-Capillary: 299 mg/dL — ABNORMAL HIGH (ref 70–99)
Glucose-Capillary: 341 mg/dL — ABNORMAL HIGH (ref 70–99)
Glucose-Capillary: 323 mg/dL — ABNORMAL HIGH (ref 70–99)

## 2020-09-28 LAB — FIBRIN DERIVATIVES D-DIMER (ARMC ONLY): Fibrin derivatives D-dimer (ARMC): 342.86 ng/mL (FEU) (ref 0.00–499.00)

## 2020-09-28 MED ORDER — INSULIN ASPART 100 UNIT/ML ~~LOC~~ SOLN
8.0000 [IU] | Freq: Three times a day (TID) | SUBCUTANEOUS | Status: DC
  Administered 2020-09-28 – 2020-09-29 (×3): 8 [IU] via SUBCUTANEOUS
  Filled 2020-09-28 (×3): qty 1

## 2020-09-28 MED ORDER — INSULIN DETEMIR 100 UNIT/ML ~~LOC~~ SOLN
20.0000 [IU] | Freq: Every day | SUBCUTANEOUS | Status: DC
  Administered 2020-09-28 – 2020-09-29 (×2): 20 [IU] via SUBCUTANEOUS
  Filled 2020-09-28 (×3): qty 0.2

## 2020-09-28 NOTE — Hospital Course (Signed)
55 year old male with obesity, HTN, DM 2, came into the hospital with cough, shortness of breath.  His symptoms initially started on 12/28, and he actually tested positive for Covid on 12/30.  Due to worsening of his symptoms and progressive shortness of breath he came into the hospital and was admitted on 1/5.  Chest x-ray on admission showed bilateral infiltrates

## 2020-09-28 NOTE — Progress Notes (Signed)
Inpatient Diabetes Program Recommendations  AACE/ADA: New Consensus Statement on Inpatient Glycemic Control (2015)  Target Ranges:  Prepandial:   less than 140 mg/dL      Peak postprandial:   less than 180 mg/dL (1-2 hours)      Critically ill patients:  140 - 180 mg/dL   Lab Results  Component Value Date   GLUCAP 232 (H) 09/28/2020    Review of Glycemic Control Results for Jack Barajas, Jack Barajas (MRN [id number]) as of 09/28/2020 09:59  Ref. Range 09/27/2020 07:59 09/27/2020 11:57 09/27/2020 16:09 09/27/2020 20:32 09/28/2020 07:36  Glucose-Capillary Latest Ref Range: 70 - 99 mg/dL 230 (H) 341 (H) 430 (H) 375 (H) 232 (H)    Inpatient Diabetes Program Recommendations:   -Change diet to carb modified -Increase Levemir to 20 units daily -Increase Novolog meal coverage to 8 units tid if eats 50% Secure chat sent to Dr. Arbutus Ped.  Thank you, Nani Gasser. Katielynn Horan, RN, MSN, CDE  Diabetes Coordinator Inpatient Glycemic Control Team Team Pager [phone] (8am-5pm) 09/28/2020 10:06 AM

## 2020-09-28 NOTE — Progress Notes (Signed)
PROGRESS NOTE    Jack Barajas   [id number]:[id number]  DOB: 1965/11/06  PCP: [place]    DOA: 09/21/2020 LOS: 7   Brief Narrative   55 year old male with obesity, HTN, DM 2, came into the hospital with cough, shortness of breath.  His symptoms initially started on 12/28, and he actually tested positive for Covid on 12/30.  Due to worsening of his symptoms and progressive shortness of breath he came into the hospital and was admitted on 1/5.  Chest x-ray on admission showed bilateral infiltrates     Assessment & Plan   Active Problems:   Pneumonia due to COVID-19 virus   T2DM (type 2 diabetes mellitus) ([place])   Obesity   Acute respiratory failure with hypoxia secondary to COVID-19 pneumonia -present on admission. Continues to require 13 L/min high flow nasal cannula oxygen.  Respiratory status remains somewhat tenuous with O2 desaturation with minimal exertion. Completed 5 days remdesivir. --Continue steroids and baricitinib --Incentive spirometry, flutter valve, proning as tolerated -- Monitor inflammatory markers and CMP -- Given IV Lasix on 1/10, 1/11 -appears euvolemic and oxygen requirement is stable, reassess tomorrow -- Continue as needed antitussives   Steroid-induced hyperglycemia Type 2 diabetes -last A1c was 7%.  Levemir increased to 20 units at bedtime.  NovoLog 8 units 3 times daily with meals plus sliding scale.  Transaminitis -likely due to COVID infection.  Monitor CMP.   Obesity: Body mass index is 36.94 kg/m.  Complicates overall care and prognosis.  DVT prophylaxis:    Diet:  Diet Orders (From admission, onward)    Start     Ordered   09/28/20 1010  Diet Carb Modified Fluid consistency: Thin; Room service appropriate? Yes  Diet effective now       Question Answer Comment  Diet-HS Snack? Nothing   Calorie Level Medium 1600-2000   Fluid consistency: Thin   Room service appropriate? Yes      09/28/20 1009            Code Status:  Full Code    Subjective 09/28/20    Patient seen this morning and reports feeling okay.  Has been up to the bathroom and does get short of breath by the time he returns to bed.  Compared to admission, he states he feels like a new person.  No significant coughing other than when using incentive spirometer.  No fevers or chills or other complaints.   Disposition Plan & Communication   Status is: Inpatient  Remains inpatient appropriate because:IV treatments appropriate due to intensity of illness or inability to take PO   Dispo: The patient is from: Home              Anticipated d/c is to: Home              Anticipated d/c date is: 3 days              Patient currently is not medically stable to d/c.   Family Communication: None at bedside, will attempt to call   Consults, Procedures, Significant Events   Consultants:   None  Procedures:   None  Antimicrobials:  Anti-infectives (From admission, onward)   Start     Dose/Rate Route Frequency Ordered Stop   09/22/20 1000  remdesivir 100 mg in sodium chloride 0.9 % 100 mL IVPB       "Followed by" Linked Group Details   100 mg 200 mL/hr over 30 Minutes Intravenous Daily 09/21/20 1230 09/25/20  1009   09/21/20 1330  remdesivir 200 mg in sodium chloride 0.9% 250 mL IVPB       "Followed by" Linked Group Details   200 mg 580 mL/hr over 30 Minutes Intravenous Once 09/21/20 1230 09/21/20 1658        Objective   Vitals:   09/28/20 0025 09/28/20 0458 09/28/20 0735 09/28/20 1145  BP: 129/85 114/79 122/83 113/80  Pulse: 79 77 81 (!) 103  Resp: 16 20 18 18   Temp: 98.9 F (37.2 C) 98.5 F (36.9 C) 99 F (37.2 C) 97.8 F (36.6 C)  TempSrc:      SpO2: 95% (!) 89% 90% 93%  Weight:      Height:       No intake or output data in the 24 hours ending 09/28/20 1414 Filed Weights   09/21/20 0454  Weight: 127 kg    Physical Exam:  General exam: awake, alert, no acute distress HEENT: clear conjunctiva, anicteric sclera,  moist mucus membranes, hearing grossly normal  Respiratory system: CTAB but diminished bases, no wheezes or rhonchi, normal respiratory effort at rest on 13 L/min HFNC oxygen. Cardiovascular system: normal S1/S2, RRR, no pedal edema.   Gastrointestinal system: soft, NT, ND Central nervous system: A&O x3. no gross focal neurologic deficits, normal speech Extremities: moves all, no edema, normal tone Psychiatry: normal mood, congruent affect, judgement and insight appear normal  Labs   Data Reviewed: I have personally reviewed following labs and imaging studies  CBC: Recent Labs  Lab 09/22/20 0509 09/23/20 0310 09/24/20 0540 09/25/20 [id number] 09/26/20 0413 09/27/20 0428 09/28/20 0603  WBC 10.9* 8.5 13.1* 13.6* 15.7* 18.7* 15.2*  NEUTROABS 8.9* 6.6 9.7* 9.9* 12.0*  --   --   HGB 12.8* 12.8* 14.0 13.8 13.6 14.4 13.6  HCT 40.3 40.5 42.6 43.4 42.7 44.4 42.1  MCV 89.6 90.6 90.4 90.8 89.3 90.2 88.8  PLT 258 282 415* 467* 491* 501* [id number]   Basic Metabolic Panel: Recent Labs  Lab 09/22/20 0509 09/23/20 0310 09/24/20 0540 09/25/20 0608 09/26/20 0413 09/27/20 0428 09/28/20 0603  NA 132* 136 138 140 137 136 137  K 4.4 4.9 4.7 4.8 5.0 5.3* 4.8  CL 93* 97* 99 100 99 96* 100  CO2 27 30 30 30 26 29 28   GLUCOSE 159* 243* 255* 246* 255* 249* 241*  BUN 18 20 26* 25* 24* 25* 25*  CREATININE 0.75 0.77 0.75 0.78 0.66 0.90 0.83  CALCIUM 9.1 9.3 9.7 9.8 9.5 9.6 9.3  MG 2.2 2.5* 2.4 2.4 2.4  --   --   PHOS 3.2 3.2 3.6 3.4 3.2  --   --    GFR: Estimated Creatinine Clearance: 143.7 mL/min (by C-G formula based on SCr of 0.83 mg/dL). Liver Function Tests: Recent Labs  Lab 09/24/20 0540 09/25/20 0608 09/26/20 0413 09/27/20 0428 09/28/20 0603  AST 52* 64* 51* 39 30  ALT 62* 87* 94* 87* 73*  ALKPHOS 52 53 48 54 48  BILITOT 0.6 0.7 0.7 0.8 0.7  PROT 7.5 7.4 7.3 7.2 6.5  ALBUMIN 3.4* 3.5 3.4* 3.5 3.1*   No results for input(s): LIPASE, AMYLASE in the last 168 hours. No results for  input(s): AMMONIA in the last 168 hours. Coagulation Profile: No results for input(s): INR, PROTIME in the last 168 hours. Cardiac Enzymes: No results for input(s): CKTOTAL, CKMB, CKMBINDEX, TROPONINI in the last 168 hours. BNP (last 3 results) No results for input(s): PROBNP in the last 8760 hours. HbA1C: No results for input(s):  HGBA1C in the last 72 hours. CBG: Recent Labs  Lab 09/27/20 1157 09/27/20 1609 09/27/20 2032 09/28/20 0736 09/28/20 1145  GLUCAP 341* 430* 375* 232* 299*   Lipid Profile: No results for input(s): CHOL, HDL, LDLCALC, TRIG, CHOLHDL, LDLDIRECT in the last 72 hours. Thyroid Function Tests: No results for input(s): TSH, T4TOTAL, FREET4, T3FREE, THYROIDAB in the last 72 hours. Anemia Panel: No results for input(s): VITAMINB12, FOLATE, FERRITIN, TIBC, IRON, RETICCTPCT in the last 72 hours. Sepsis Labs: No results for input(s): PROCALCITON, LATICACIDVEN in the last 168 hours.  No results found for this or any previous visit (from the past 240 hour(s)).    Imaging Studies   DG Chest Port 1 View  Result Date: 09/27/2020 CLINICAL DATA:  COVID.  Shortness of breath EXAM: PORTABLE CHEST 1 VIEW COMPARISON:  09/21/2020. FINDINGS: Pneumomediastinum cannot be excluded. No pneumothorax noted. Cardiomegaly. No pulmonary venous congestion. Low lung volumes. Patchy bilateral pulmonary infiltrates are again. Slight progression from prior exam cannot be excluded. No pleural effusion or pneumothorax. Degenerative change thoracic spine. IMPRESSION: 1.  Pneumomediastinum cannot be excluded. 2. Patchy bilateral pulmonary infiltrates are again noted. Slight progression from prior exam cannot be excluded. Electronically Signed   By: [place]   On: 09/27/2020 14:32     Medications   Scheduled Meds: . baricitinib  4 mg Oral Daily  . enoxaparin (LOVENOX) injection  60 mg Subcutaneous Q24H  . fluticasone furoate-vilanterol  1 puff Inhalation Daily  . insulin aspart   0-15 Units Subcutaneous TID WC  . insulin aspart  0-5 Units Subcutaneous QHS  . insulin aspart  8 Units Subcutaneous TID WC  . insulin detemir  20 Units Subcutaneous QHS  . methylPREDNISolone (SOLU-MEDROL) injection  60 mg Intravenous Q12H  . oxymetazoline  1 spray Each Nare BID  . saline  1 application Each Nare F5D   Continuous Infusions:     LOS: 7 days    Time spent: 30 minutes    Ezekiel Slocumb, DO [facility]  09/28/2020, 2:14 PM    If 7PM-7AM, please contact night-coverage. How to contact the [facility] Attending or Consulting provider [place] or covering provider during after hours [place], for this patient?    1. Check the care team in [facility] and look for a) attending/consulting TRH provider listed and b) the [facility] team listed 2. Log into [email or web address] and use [place]'s universal password to access. If you do not have the password, please contact the hospital operator. 3. Locate the [facility] provider you are looking for under [facility] and page to a number that you can be directly reached. 4. If you still have difficulty reaching the provider, please page the [facility] (Director on Call) for the Hospitalists listed on amion for assistance.

## 2020-09-29 DIAGNOSIS — J1282 Pneumonia due to coronavirus disease 2019: Secondary | ICD-10-CM | POA: Diagnosis not present

## 2020-09-29 DIAGNOSIS — U071 COVID-19: Secondary | ICD-10-CM | POA: Diagnosis not present

## 2020-09-29 LAB — CBC
HCT: 42.5 % (ref 39.0–52.0)
Hemoglobin: 14 g/dL (ref 13.0–17.0)
MCH: 29.5 pg (ref 26.0–34.0)
MCHC: 32.9 g/dL (ref 30.0–36.0)
MCV: 89.7 fL (ref 80.0–100.0)
Platelets: 412 10*3/uL — ABNORMAL HIGH (ref 150–400)
RBC: 4.74 MIL/uL (ref 4.22–5.81)
RDW: 13.3 % (ref 11.5–15.5)
WBC: 15.5 10*3/uL — ABNORMAL HIGH (ref 4.0–10.5)
nRBC: 0 % (ref 0.0–0.2)

## 2020-09-29 LAB — COMPREHENSIVE METABOLIC PANEL
ALT: 64 U/L — ABNORMAL HIGH (ref 0–44)
AST: 25 U/L (ref 15–41)
Albumin: 3.1 g/dL — ABNORMAL LOW (ref 3.5–5.0)
Alkaline Phosphatase: 45 U/L (ref 38–126)
Anion gap: 9 (ref 5–15)
BUN: 22 mg/dL — ABNORMAL HIGH (ref 6–20)
CO2: 27 mmol/L (ref 22–32)
Calcium: 9.2 mg/dL (ref 8.9–10.3)
Chloride: 99 mmol/L (ref 98–111)
Creatinine, Ser: 0.87 mg/dL (ref 0.61–1.24)
GFR, Estimated: >60 mL/min (ref >60)
Glucose, Bld: 225 mg/dL — ABNORMAL HIGH (ref 70–99)
Potassium: 4.9 mmol/L (ref 3.5–5.1)
Sodium: 135 mmol/L (ref 135–145)
Total Bilirubin: 0.8 mg/dL (ref 0.3–1.2)
Total Protein: 6.4 g/dL — ABNORMAL LOW (ref 6.5–8.1)

## 2020-09-29 LAB — GLUCOSE, CAPILLARY
Glucose-Capillary: 188 mg/dL — ABNORMAL HIGH (ref 70–99)
Glucose-Capillary: 258 mg/dL — ABNORMAL HIGH (ref 70–99)
Glucose-Capillary: 340 mg/dL — ABNORMAL HIGH (ref 70–99)
Glucose-Capillary: 278 mg/dL — ABNORMAL HIGH (ref 70–99)

## 2020-09-29 MED ORDER — IPRATROPIUM-ALBUTEROL 20-100 MCG/ACT IN AERS
1.0000 | INHALATION_SPRAY | Freq: Four times a day (QID) | RESPIRATORY_TRACT | Status: DC
  Administered 2020-09-29 – 2020-10-01 (×6): 1 via RESPIRATORY_TRACT
  Filled 2020-09-29 (×2): qty 4

## 2020-09-29 MED ORDER — OXYMETAZOLINE HCL 0.05 % NA SOLN
1.0000 | Freq: Two times a day (BID) | NASAL | Status: DC | PRN
  Filled 2020-09-29: qty 15

## 2020-09-29 MED ORDER — INSULIN ASPART 100 UNIT/ML ~~LOC~~ SOLN
10.0000 [IU] | Freq: Three times a day (TID) | SUBCUTANEOUS | Status: DC
  Administered 2020-09-29 – 2020-09-30 (×3): 10 [IU] via SUBCUTANEOUS
  Filled 2020-09-29 (×2): qty 1

## 2020-09-29 MED ORDER — FLUTICASONE PROPIONATE 50 MCG/ACT NA SUSP
1.0000 | Freq: Every day | NASAL | Status: DC
  Administered 2020-09-30 – 2020-10-01 (×2): 1 via NASAL
  Filled 2020-09-29: qty 16

## 2020-09-29 NOTE — Progress Notes (Signed)
PROGRESS NOTE    Jack Barajas   QHU:[id number]  DOB: 12-23-1965  PCP: [place]    DOA: 09/21/2020 LOS: 32   Brief Narrative   55 year old male with obesity, HTN, DM 2, came into the hospital with cough, shortness of breath.  His symptoms initially started on 12/28, and he actually tested positive for Covid on 12/30.  Due to worsening of his symptoms and progressive shortness of breath he came into the hospital and was admitted on 1/5.  Chest x-ray on admission showed bilateral infiltrates     Assessment & Plan   Active Problems:   Pneumonia due to COVID-19 virus   T2DM (type 2 diabetes mellitus) ([place])   Obesity   Acute respiratory failure with hypoxia secondary to COVID-19 pneumonia -present on admission. Continues to require 13 L/min high flow nasal cannula oxygen.  Respiratory status remains somewhat tenuous with O2 desaturation with minimal exertion. Completed 5 days remdesivir. --Continue steroids and baricitinib --Combivent scheduled, Breo daily --Incentive spirometry, flutter valve, proning as tolerated -- Monitor inflammatory markers and CMP -- Given IV Lasix on 1/10, 1/11 -appears euvolemic and oxygen requirement is stable, reassess tomorrow -- Continue as needed antitussives   Steroid-induced hyperglycemia Type 2 diabetes -last A1c was 7%.   --Levemir increased to 20 units at bedtime.   --NovoLog increased to 10 units 3 times daily with meals  --sliding scale NovoLog   Transaminitis -likely due to COVID infection.  Monitor CMP.   Obesity: Body mass index is 36.94 kg/m.  Complicates overall care and prognosis.  DVT prophylaxis:    Diet:  Diet Orders (From admission, onward)    Start     Ordered   09/28/20 1010  Diet Carb Modified Fluid consistency: Thin; Room service appropriate? Yes  Diet effective now       Question Answer Comment  Diet-HS Snack? Nothing   Calorie Level Medium 1600-2000   Fluid consistency: Thin   Room service  appropriate? Yes      09/28/20 1009            Code Status: Full Code    Subjective 09/29/20    Patient says he feels great today.  Slept good.  Does not feel short of breath when he gets up to walk to the bathroom.  Is on 15 L/min, said he was on 10 L earlier.  No fevers or chills, chest pain, nausea vomiting or other acute complaints.   Disposition Plan & Communication   Status is: Inpatient  Remains inpatient appropriate because:IV treatments appropriate due to intensity of illness or inability to take PO.  Patient continues have significant supplemental oxygen requirement.     Dispo: The patient is from: Home              Anticipated d/c is to: Home              Anticipated d/c date is: 3 days              Patient currently is not medically stable to d/c.   Family Communication: None at bedside, will attempt to call   Consults, Procedures, Significant Events   Consultants:   None  Procedures:   None  Antimicrobials:  Anti-infectives (From admission, onward)   Start     Dose/Rate Route Frequency Ordered Stop   09/22/20 1000  remdesivir 100 mg in sodium chloride 0.9 % 100 mL IVPB       "Followed by" Linked Group Details  100 mg 200 mL/hr over 30 Minutes Intravenous Daily 09/21/20 1230 09/25/20 1009   09/21/20 1330  remdesivir 200 mg in sodium chloride 0.9% 250 mL IVPB       "Followed by" Linked Group Details   200 mg 580 mL/hr over 30 Minutes Intravenous Once 09/21/20 1230 09/21/20 1658        Objective   Vitals:   09/29/20 0749 09/29/20 1138 09/29/20 1438 09/29/20 1622  BP: (!) 136/93 138/90  (!) 126/92  Pulse: 85 89  97  Resp: 18 16  16   Temp: 98.3 F (36.8 C) 98.2 F (36.8 C)  98.3 F (36.8 C)  TempSrc: Oral     SpO2: 92% 92% 90% 95%  Weight:      Height:       No intake or output data in the 24 hours ending 09/29/20 1624 Filed Weights   09/21/20 0454  Weight: 127 kg    Physical Exam:  General exam: awake, alert, no acute  distress Respiratory system: normal respiratory effort at rest, no accessory muscle use or conversational dyspnea on 15 L/min HFNC oxygen. Cardiovascular system: RRR, no pedal edema.   Central nervous system: A&O x3. no gross focal neurologic deficits, normal speech Extremities: moves all, no edema, no cyanosis Psychiatry: normal mood, congruent affect, judgement and insight appear normal  Labs   Data Reviewed: I have personally reviewed following labs and imaging studies  CBC: Recent Labs  Lab 09/23/20 0310 09/24/20 0540 09/25/20 [id number] 09/26/20 0413 09/27/20 0428 09/28/20 0603 09/29/20 0555  WBC 8.5 13.1* 13.6* 15.7* 18.7* 15.2* 15.5*  NEUTROABS 6.6 9.7* 9.9* 12.0*  --   --   --   HGB 12.8* 14.0 13.8 13.6 14.4 13.6 14.0  HCT 40.5 42.6 43.4 42.7 44.4 42.1 42.5  MCV 90.6 90.4 90.8 89.3 90.2 88.8 89.7  PLT 282 415* 467* 491* 501* 464* [id number]*   Basic Metabolic Panel: Recent Labs  Lab 09/23/20 0310 09/24/20 0540 09/25/20 0608 09/26/20 0413 09/27/20 0428 09/28/20 0603 09/29/20 0555  NA 136 138 140 137 136 137 135  K 4.9 4.7 4.8 5.0 5.3* 4.8 4.9  CL 97* 99 100 99 96* 100 99  CO2 30 30 30 26 29 28 27   GLUCOSE 243* 255* 246* 255* 249* 241* 225*  BUN 20 26* 25* 24* 25* 25* 22*  CREATININE 0.77 0.75 0.78 0.66 0.90 0.83 0.87  CALCIUM 9.3 9.7 9.8 9.5 9.6 9.3 9.2  MG 2.5* 2.4 2.4 2.4  --   --   --   PHOS 3.2 3.6 3.4 3.2  --   --   --    GFR: Estimated Creatinine Clearance: 137.1 mL/min (by C-G formula based on SCr of 0.87 mg/dL). Liver Function Tests: Recent Labs  Lab 09/25/20 0608 09/26/20 0413 09/27/20 0428 09/28/20 0603 09/29/20 0555  AST 64* 51* 39 30 25  ALT 87* 94* 87* 73* 64*  ALKPHOS 53 48 54 48 45  BILITOT 0.7 0.7 0.8 0.7 0.8  PROT 7.4 7.3 7.2 6.5 6.4*  ALBUMIN 3.5 3.4* 3.5 3.1* 3.1*   No results for input(s): LIPASE, AMYLASE in the last 168 hours. No results for input(s): AMMONIA in the last 168 hours. Coagulation Profile: No results for input(s): INR,  PROTIME in the last 168 hours. Cardiac Enzymes: No results for input(s): CKTOTAL, CKMB, CKMBINDEX, TROPONINI in the last 168 hours. BNP (last 3 results) No results for input(s): PROBNP in the last 8760 hours. HbA1C: No results for input(s): HGBA1C in the last 72  hours. CBG: Recent Labs  Lab 09/28/20 1145 09/28/20 1555 09/28/20 2052 09/29/20 0752 09/29/20 1119  GLUCAP 299* 341* 323* 188* 258*   Lipid Profile: No results for input(s): CHOL, HDL, LDLCALC, TRIG, CHOLHDL, LDLDIRECT in the last 72 hours. Thyroid Function Tests: No results for input(s): TSH, T4TOTAL, FREET4, T3FREE, THYROIDAB in the last 72 hours. Anemia Panel: No results for input(s): VITAMINB12, FOLATE, FERRITIN, TIBC, IRON, RETICCTPCT in the last 72 hours. Sepsis Labs: No results for input(s): PROCALCITON, LATICACIDVEN in the last 168 hours.  No results found for this or any previous visit (from the past 240 hour(s)).    Imaging Studies   No results found.   Medications   Scheduled Meds: . baricitinib  4 mg Oral Daily  . enoxaparin (LOVENOX) injection  60 mg Subcutaneous Q24H  . fluticasone furoate-vilanterol  1 puff Inhalation Daily  . insulin aspart  0-15 Units Subcutaneous TID WC  . insulin aspart  0-5 Units Subcutaneous QHS  . insulin aspart  10 Units Subcutaneous TID WC  . insulin detemir  20 Units Subcutaneous QHS  . methylPREDNISolone (SOLU-MEDROL) injection  60 mg Intravenous Q12H  . oxymetazoline  1 spray Each Nare BID  . saline  1 application Each Nare T6Y   Continuous Infusions:     LOS: 8 days    Time spent: 25 minutes with greater than 50% spent at bedside and in coordination of care    Ezekiel Slocumb, DO [facility]  09/29/2020, 4:24 PM    If 7PM-7AM, please contact night-coverage. How to contact the [facility] Attending or Consulting provider [place] or covering provider during after hours [place], for this patient?    1. Check the care team in [facility] and look for a)  attending/consulting TRH provider listed and b) the [facility] team listed 2. Log into [email or web address] and use [place]'s universal password to access. If you do not have the password, please contact the hospital operator. 3. Locate the [facility] provider you are looking for under [facility] and page to a number that you can be directly reached. 4. If you still have difficulty reaching the provider, please page the [facility] (Director on Call) for the Hospitalists listed on amion for assistance.

## 2020-09-29 NOTE — Progress Notes (Signed)
Inpatient Diabetes Program Recommendations  AACE/ADA: New Consensus Statement on Inpatient Glycemic Control (2015)  Target Ranges:  Prepandial:   less than 140 mg/dL      Peak postprandial:   less than 180 mg/dL (1-2 hours)      Critically ill patients:  140 - 180 mg/dL   Lab Results  Component Value Date   GLUCAP 188 (H) 09/29/2020    Review of Glycemic Control Results for KVON, MCILHENNY (MRN [id number]) as of 09/29/2020 11:21  Ref. Range 09/28/2020 07:36 09/28/2020 11:45 09/28/2020 15:55 09/28/2020 20:52 09/29/2020 07:52  Glucose-Capillary Latest Ref Range: 70 - 99 mg/dL 232 (H) 299 (H) 341 (H) 323 (H) 188 (H)   Inpatient Diabetes Program Recommendations:   Noted postprandial CBGs still elevated. -Novolog 10 units tid meal coverage  Thank you, Nani Gasser. Ermalee Mealy, RN, MSN, CDE  Diabetes Coordinator Inpatient Glycemic Control Team Team Pager [phone] (8am-5pm) 09/29/2020 11:22 AM

## 2020-09-30 ENCOUNTER — Inpatient Hospital Stay: Payer: 59

## 2020-09-30 DIAGNOSIS — U071 COVID-19: Secondary | ICD-10-CM | POA: Diagnosis not present

## 2020-09-30 DIAGNOSIS — J1282 Pneumonia due to coronavirus disease 2019: Secondary | ICD-10-CM | POA: Diagnosis not present

## 2020-09-30 LAB — COMPREHENSIVE METABOLIC PANEL
ALT: 59 U/L — ABNORMAL HIGH (ref 0–44)
AST: 26 U/L (ref 15–41)
Albumin: 3 g/dL — ABNORMAL LOW (ref 3.5–5.0)
Alkaline Phosphatase: 43 U/L (ref 38–126)
Anion gap: 11 (ref 5–15)
BUN: 21 mg/dL — ABNORMAL HIGH (ref 6–20)
CO2: 27 mmol/L (ref 22–32)
Calcium: 8.7 mg/dL — ABNORMAL LOW (ref 8.9–10.3)
Chloride: 99 mmol/L (ref 98–111)
Creatinine, Ser: 0.71 mg/dL (ref 0.61–1.24)
GFR, Estimated: >60 mL/min (ref >60)
Glucose, Bld: 191 mg/dL — ABNORMAL HIGH (ref 70–99)
Potassium: 4.8 mmol/L (ref 3.5–5.1)
Sodium: 137 mmol/L (ref 135–145)
Total Bilirubin: 0.7 mg/dL (ref 0.3–1.2)
Total Protein: 6.4 g/dL — ABNORMAL LOW (ref 6.5–8.1)

## 2020-09-30 LAB — CBC
HCT: 42.7 % (ref 39.0–52.0)
Hemoglobin: 13.7 g/dL (ref 13.0–17.0)
MCH: 28.8 pg (ref 26.0–34.0)
MCHC: 32.1 g/dL (ref 30.0–36.0)
MCV: 89.9 fL (ref 80.0–100.0)
Platelets: 369 10*3/uL (ref 150–400)
RBC: 4.75 MIL/uL (ref 4.22–5.81)
RDW: 13.1 % (ref 11.5–15.5)
WBC: 15.5 10*3/uL — ABNORMAL HIGH (ref 4.0–10.5)
nRBC: 0 % (ref 0.0–0.2)

## 2020-09-30 LAB — GLUCOSE, CAPILLARY
Glucose-Capillary: 186 mg/dL — ABNORMAL HIGH (ref 70–99)
Glucose-Capillary: 282 mg/dL — ABNORMAL HIGH (ref 70–99)
Glucose-Capillary: 275 mg/dL — ABNORMAL HIGH (ref 70–99)
Glucose-Capillary: 286 mg/dL — ABNORMAL HIGH (ref 70–99)

## 2020-09-30 LAB — MAGNESIUM: Magnesium: 2.3 mg/dL (ref 1.7–2.4)

## 2020-09-30 LAB — BRAIN NATRIURETIC PEPTIDE: B Natriuretic Peptide: 23.8 pg/mL (ref 0.0–100.0)

## 2020-09-30 MED ORDER — INSULIN DETEMIR 100 UNIT/ML ~~LOC~~ SOLN
23.0000 [IU] | Freq: Every day | SUBCUTANEOUS | Status: DC
  Administered 2020-09-30: 22:00:00 23 [IU] via SUBCUTANEOUS
  Filled 2020-09-30 (×2): qty 0.23

## 2020-09-30 MED ORDER — INSULIN ASPART 100 UNIT/ML ~~LOC~~ SOLN
14.0000 [IU] | Freq: Three times a day (TID) | SUBCUTANEOUS | Status: DC
  Administered 2020-09-30 – 2020-10-01 (×3): 14 [IU] via SUBCUTANEOUS
  Filled 2020-09-30 (×3): qty 1

## 2020-09-30 NOTE — Progress Notes (Signed)
Inpatient Diabetes Program Recommendations  AACE/ADA: New Consensus Statement on Inpatient Glycemic Control   Target Ranges:  Prepandial:   less than 140 mg/dL      Peak postprandial:   less than 180 mg/dL (1-2 hours)      Critically ill patients:  140 - 180 mg/dL   Results for Jack Barajas, Jack Barajas (MRN [id number]) as of 09/30/2020 13:42  Ref. Range 09/29/2020 07:52 09/29/2020 11:19 09/29/2020 16:22 09/29/2020 20:32 09/30/2020 07:57 09/30/2020 11:40  Glucose-Capillary Latest Ref Range: 70 - 99 mg/dL 188 (H) 258 (H) 340 (H) 278 (H) 186 (H) 282 (H)   Review of Glycemic Control  Current orders for Inpatient glycemic control: Levemir 20 units QHS, Novolog 10 units TID with meals, Novolog 0-15 units TID with meals, Novolog 0-5 units QHS; Solumedrol 60 mg Q12H  Inpatient Diabetes Program Recommendations:    Insulin: If steroids are continued as ordered, please consider increasing Levemir to 23 units QHS and meal coverage to Novolog 14 units TID with meals if patient eats at least 50% of meals.  Thanks, Barnie Alderman, RN, MSN, CDE Diabetes Coordinator Inpatient Diabetes Program [phone] (Team Pager from 8am to 5pm)

## 2020-09-30 NOTE — Progress Notes (Signed)
Unable to give ordered iv steroid at this time as IV was found to be dislodged.  IV removed, catheter intact, IV team consult ordered to place new PIV.

## 2020-09-30 NOTE — Progress Notes (Addendum)
PROGRESS NOTE    Jack Barajas   [id number]  DOB: 10/27/1965  PCP: [facility]    DOA: 09/21/2020 LOS: 56   Brief Narrative   55 year old male with obesity, HTN, DM 2, came into the hospital with cough, shortness of breath.  His symptoms initially started on 12/28, and he actually tested positive for Covid on 12/30.  Due to worsening of his symptoms and progressive shortness of breath he came into the hospital and was admitted on 1/5.  Chest x-ray on admission showed bilateral infiltrates     Assessment & Plan   Active Problems:   Pneumonia due to COVID-19 virus   T2DM (type 2 diabetes mellitus) ([place])   Obesity   Acute respiratory failure with hypoxia secondary to COVID-19 pneumonia -present on admission. Continues to require 12 L/min high flow nasal cannula oxygen.   CRP has normalized. Respiratory status remains tenuous with O2 desaturation with minimal exertion, talking. Given IV Lasix on 1/10, 1/11 - appears euvolemic and oxygen requirement is stable.  BNP normal at 23. Completed 5 days remdesivir. --Continue steroids and baricitinib --Combivent scheduled, Breo daily --Monitor inflammatory markers and CMP --Continue as needed antitussives --Prone or side laying as much as possible when in bed --Incentive spirometry, flutter valve   Steroid-induced hyperglycemia Type 2 diabetes - last A1c was 7%.   --increase Levemir to 23 units at bedtime (from 20) --increase NovoLog to 14 units 3 times daily with meals (from 10) --sliding scale NovoLog   Transaminitis -likely due to COVID infection.  Monitor CMP.   Obesity: Body mass index is 36.94 kg/m.  Complicates overall care and prognosis.  DVT prophylaxis:    Diet:  Diet Orders (From admission, onward)    Start     Ordered   09/28/20 1010  Diet Carb Modified Fluid consistency: Thin; Room service appropriate? Yes  Diet effective now       Question Answer Comment  Diet-HS Snack? Nothing   Calorie  Level Medium 1600-2000   Fluid consistency: Thin   Room service appropriate? Yes      09/28/20 1009            Code Status: Full Code    Subjective 09/30/20    Patient up in chair when seen today.  Reports overall feeling good.  Some shortness of breath on exertion but not bad.  No fevers or chills, chest pain, shortness of breath at rest, nausea vomiting or other acute complaints.   Disposition Plan & Communication   Status is: Inpatient  Remains inpatient appropriate because:IV treatments appropriate due to intensity of illness or inability to take PO.  Patient continues have significant supplemental oxygen requirement.     Dispo: The patient is from: Home              Anticipated d/c is to: Home              Anticipated d/c date is: 3 days              Patient currently is not medically stable to d/c.   Family Communication: spoke with patient's wife by phone this afternoon.   Consults, Procedures, Significant Events   Consultants:   None  Procedures:   None  Antimicrobials:  Anti-infectives (From admission, onward)   Start     Dose/Rate Route Frequency Ordered Stop   09/22/20 1000  remdesivir 100 mg in sodium chloride 0.9 % 100 mL IVPB       "Followed  by" Linked Group Details   100 mg 200 mL/hr over 30 Minutes Intravenous Daily 09/21/20 1230 09/25/20 1009   09/21/20 1330  remdesivir 200 mg in sodium chloride 0.9% 250 mL IVPB       "Followed by" Linked Group Details   200 mg 580 mL/hr over 30 Minutes Intravenous Once 09/21/20 1230 09/21/20 1658        Objective   Vitals:   09/30/20 0656 09/30/20 0755 09/30/20 1140 09/30/20 1514  BP:  121/86 112/78 128/87  Pulse: 63 72 (!) 105 (!) 102  Resp:  18 16 16   Temp:  (!) 97.5 F (36.4 C) 98 F (36.7 C) 98.8 F (37.1 C)  TempSrc:      SpO2: 99% 92% 95% 93%  Weight:      Height:       No intake or output data in the 24 hours ending 09/30/20 1518 Filed Weights   09/21/20 0454  Weight: 127 kg     Physical Exam:  General exam: awake, alert, no acute distress, up in chair Respiratory system: spO2 during encounter 84-88% during conversation, no accessory muscle use or conversational dyspnea, on 12 L/min HFNC oxygen. Cardiovascular system: RRR, no pedal edema.   Central nervous system: A&O x3. no gross focal neurologic deficits, normal speech Extremities: moves all, no edema, no cyanosis Psychiatry: normal mood, congruent affect, judgement and insight appear normal  Labs   Data Reviewed: I have personally reviewed following labs and imaging studies  CBC: Recent Labs  Lab 09/24/20 0540 09/25/20 0608 09/26/20 0413 09/27/20 0428 09/28/20 0603 09/29/20 0555 09/30/20 0409  WBC 13.1* 13.6* 15.7* 18.7* 15.2* 15.5* 15.5*  NEUTROABS 9.7* 9.9* 12.0*  --   --   --   --   HGB 14.0 13.8 13.6 14.4 13.6 14.0 13.7  HCT 42.6 43.4 42.7 44.4 42.1 42.5 42.7  MCV 90.4 90.8 89.3 90.2 88.8 89.7 89.9  PLT 415* 467* 491* 501* 464* 412* 062   Basic Metabolic Panel: Recent Labs  Lab 09/24/20 0540 09/25/20 0608 09/26/20 0413 09/27/20 0428 09/28/20 0603 09/29/20 0555 09/30/20 0409  NA 138 140 137 136 137 135 137  K 4.7 4.8 5.0 5.3* 4.8 4.9 4.8  CL 99 100 99 96* 100 99 99  CO2 30 30 26 29 28 27 27   GLUCOSE 255* 246* 255* 249* 241* 225* 191*  BUN 26* 25* 24* 25* 25* 22* 21*  CREATININE 0.75 0.78 0.66 0.90 0.83 0.87 0.71  CALCIUM 9.7 9.8 9.5 9.6 9.3 9.2 8.7*  MG 2.4 2.4 2.4  --   --   --  2.3  PHOS 3.6 3.4 3.2  --   --   --   --    GFR: Estimated Creatinine Clearance: 149.1 mL/min (by C-G formula based on SCr of 0.71 mg/dL). Liver Function Tests: Recent Labs  Lab 09/26/20 0413 09/27/20 0428 09/28/20 0603 09/29/20 0555 09/30/20 0409  AST 51* 39 30 25 26   ALT 94* 87* 73* 64* 59*  ALKPHOS 48 54 48 45 43  BILITOT 0.7 0.8 0.7 0.8 0.7  PROT 7.3 7.2 6.5 6.4* 6.4*  ALBUMIN 3.4* 3.5 3.1* 3.1* 3.0*   No results for input(s): LIPASE, AMYLASE in the last 168 hours. No results for  input(s): AMMONIA in the last 168 hours. Coagulation Profile: No results for input(s): INR, PROTIME in the last 168 hours. Cardiac Enzymes: No results for input(s): CKTOTAL, CKMB, CKMBINDEX, TROPONINI in the last 168 hours. BNP (last 3 results) No results for input(s): PROBNP  in the last 8760 hours. HbA1C: No results for input(s): HGBA1C in the last 72 hours. CBG: Recent Labs  Lab 09/29/20 1119 09/29/20 1622 09/29/20 2032 09/30/20 0757 09/30/20 1140  GLUCAP 258* 340* 278* 186* 282*   Lipid Profile: No results for input(s): CHOL, HDL, LDLCALC, TRIG, CHOLHDL, LDLDIRECT in the last 72 hours. Thyroid Function Tests: No results for input(s): TSH, T4TOTAL, FREET4, T3FREE, THYROIDAB in the last 72 hours. Anemia Panel: No results for input(s): VITAMINB12, FOLATE, FERRITIN, TIBC, IRON, RETICCTPCT in the last 72 hours. Sepsis Labs: No results for input(s): PROCALCITON, LATICACIDVEN in the last 168 hours.  No results found for this or any previous visit (from the past 240 hour(s)).    Imaging Studies   DG Chest Port 1 View  Result Date: 09/30/2020 CLINICAL DATA:  Hypoxia.  COVID-19 positive EXAM: PORTABLE CHEST 1 VIEW COMPARISON:  September 27, 2020 FINDINGS: There is airspace opacity in the right upper lobe as well as in both mid and lower lung regions, similar to most recent study. Heart is borderline enlarged with pulmonary vascularity normal. No adenopathy. No bone lesions. IMPRESSION: Multifocal airspace opacity, likely due to atypical organism pneumonia. Appearance similar to recent study. Stable cardiac silhouette. Electronically Signed   By: Lowella Grip Barajas M.D.   On: 09/30/2020 08:30     Medications   Scheduled Meds: . baricitinib  4 mg Oral Daily  . enoxaparin (LOVENOX) injection  60 mg Subcutaneous Q24H  . fluticasone  1 spray Each Nare Daily  . fluticasone furoate-vilanterol  1 puff Inhalation Daily  . insulin aspart  0-15 Units Subcutaneous TID WC  . insulin  aspart  0-5 Units Subcutaneous QHS  . insulin aspart  10 Units Subcutaneous TID WC  . insulin detemir  20 Units Subcutaneous QHS  . Ipratropium-Albuterol  1 puff Inhalation Q6H WA  . methylPREDNISolone (SOLU-MEDROL) injection  60 mg Intravenous Q12H  . saline  1 application Each Nare [id number]   Continuous Infusions:     LOS: 9 days    Time spent: 25 minutes with greater than 50% spent at bedside and in coordination of care    Ezekiel Slocumb, DO [facility]  09/30/2020, 3:18 PM    If 7PM-7AM, please contact night-coverage. How to contact the [facility] Attending or Consulting provider [place] or covering provider during after hours [place], for this patient?    1. Check the care team in [facility] and look for a) attending/consulting [facility] provider listed and b) the [facility] team listed 2. Log into [email or web address] and use [place] universal password to access. If you do not have the password, please contact the hospital operator. 3. Locate the [facility] provider you are looking for under [facility] and page to a number that you can be directly reached. 4. If you still have difficulty reaching the provider, please page the [facility] (Director on Call) for the Hospitalists listed on amion for assistance.

## 2020-09-30 NOTE — Progress Notes (Signed)
Jack Barajas given nightly update

## 2020-10-01 DIAGNOSIS — J1282 Pneumonia due to coronavirus disease 2019: Secondary | ICD-10-CM | POA: Diagnosis not present

## 2020-10-01 DIAGNOSIS — U071 COVID-19: Secondary | ICD-10-CM | POA: Diagnosis not present

## 2020-10-01 LAB — COMPREHENSIVE METABOLIC PANEL
ALT: 68 U/L — ABNORMAL HIGH (ref 0–44)
AST: 33 U/L (ref 15–41)
Albumin: 3.1 g/dL — ABNORMAL LOW (ref 3.5–5.0)
Alkaline Phosphatase: 44 U/L (ref 38–126)
Anion gap: 6 (ref 5–15)
BUN: 19 mg/dL (ref 6–20)
CO2: 31 mmol/L (ref 22–32)
Calcium: 9.2 mg/dL (ref 8.9–10.3)
Chloride: 99 mmol/L (ref 98–111)
Creatinine, Ser: 0.65 mg/dL (ref 0.61–1.24)
GFR, Estimated: >60 mL/min (ref >60)
Glucose, Bld: 228 mg/dL — ABNORMAL HIGH (ref 70–99)
Potassium: 5 mmol/L (ref 3.5–5.1)
Sodium: 136 mmol/L (ref 135–145)
Total Bilirubin: 0.8 mg/dL (ref 0.3–1.2)
Total Protein: 6.4 g/dL — ABNORMAL LOW (ref 6.5–8.1)

## 2020-10-01 LAB — GLUCOSE, CAPILLARY
Glucose-Capillary: 200 mg/dL — ABNORMAL HIGH (ref 70–99)
Glucose-Capillary: 217 mg/dL — ABNORMAL HIGH (ref 70–99)

## 2020-10-01 MED ORDER — INSULIN ASPART 100 UNIT/ML FLEXPEN: 14.0000 [IU] | PEN_INJECTOR | Freq: Three times a day (TID) | SUBCUTANEOUS | 11 refills | Status: DC

## 2020-10-01 MED ORDER — INSULIN DETEMIR 100 UNIT/ML FLEXPEN: 23.0000 [IU] | PEN_INJECTOR | Freq: Every day | SUBCUTANEOUS | 1 refills | Status: DC

## 2020-10-01 MED ORDER — FLUTICASONE PROPIONATE 50 MCG/ACT NA SUSP
1.0000 | Freq: Every day | NASAL | 1 refills | Status: DC
Start: 2020-10-02 — End: 2022-10-16

## 2020-10-01 MED ORDER — IPRATROPIUM-ALBUTEROL 20-100 MCG/ACT IN AERS: 1.0000 | INHALATION_SPRAY | Freq: Four times a day (QID) | RESPIRATORY_TRACT | 1 refills | Status: DC

## 2020-10-01 MED ORDER — PEN NEEDLES 30G X 8 MM MISC: 1.0000 | Freq: Three times a day (TID) | 1 refills | Status: AC

## 2020-10-01 MED ORDER — PREDNISONE 10 MG PO TABS: ORAL_TABLET | ORAL | 0 refills | Status: AC

## 2020-10-01 MED ORDER — SALINE SPRAY 0.65 % NA SOLN: 1.0000 | NASAL | 0 refills | Status: DC | PRN

## 2020-10-01 NOTE — Progress Notes (Signed)
SATURATION QUALIFICATIONS: (This note is used to comply with regulatory documentation for home oxygen)  Patient Saturations on Room Air at Rest = 84%  Patient Saturations on Room Air while Ambulating = 84%  Patient Saturations on 4 Liters of oxygen while Ambulating = 92%  Please briefly explain why patient needs home oxygen: pt requires 4L  oxygen to maintain oxygenation greater than 90% at rest and during any activity

## 2020-10-01 NOTE — Progress Notes (Signed)
Pt stated his ride would be here in about 45 minutes

## 2020-10-01 NOTE — Discharge Instructions (Signed)
SLIDING SCALE INSULIN -   add # of insulin units to your scheduled mealtime Novolog (14 units), based on the scale below:  For glucose 70-120: none For 121-150: 2 units For 151-200: 3 units For 201-250: 5 units For 251-300: 8 units For 301-350: 11 units For 351-400: 15 units For > 400: 20 units and call your doctor

## 2020-10-01 NOTE — Discharge Planning (Addendum)
[facility]  Newborn Nursery Discharge Summary  Infant Name: Jack Barajas,Jack Barajas  Date of Birth:  02/21/2021  8:22 PM   Discharge Date: 02/23/22  MR #: [id number] Gestational Age: [redacted]w[redacted]d   Birth Weight:10 lb 10 oz (4819 g)  D/C Weight: (!) 4.681 kg (10 lb 5.1 oz)   Percent Weight Change Since Birth: -2.9     Assessment:       10 lb 10 oz (4819 g)  LGA Gestational Age: [redacted]w[redacted]d male born Vertex via Vaginal, Spontaneous     Patient Active Problem List   Diagnosis    Term newborn delivered vaginally, current hospitalization    LGA (large for gestational age) infant    At risk for hypoglycemia       -Mother had gestational thrombocytopenia; CBC in newborn at 24 HOL was reassuring with Platelet count slightly low at 128 but appropriate. WBC was normal at 22 with reassuring I:T ratio of 0.03. Consider repeat CBC outpatient in a few days to a week to ensure normalization of platelet count. Plan:   Follow-up Labs, Imaging, Consults:       -Bilirubin as indicated         Discharge Medications: Vitamin D to be given to patient by PCP     Discharge patient home with mother following review of:       -Proper feeding, urination and stooling patterns       -Umbilical cord care, genital care       -Jaundice       -Car seat safety       -Safe sleep and avoidance of cigarette smoke       -Signs of illness: temp >100.4, poor feeding, lethargy, irritability, cough or bilious emesis    PMD appointment: 2 day after d/c with Neri, Anthony R, MD     Newborn Screens and Immunizations     Total Serum Bilirubin:  TSB 8.7 @ 32 HOL (Ptx level =14.7) Newborn Metabolic Screen:  Newborn Metabolic Screen: 02/22/22  NBS - Form Number: [id number] Immunization History   Administered Date(s) Administered    Hepatitis B (Pediatric/Adolescent) 02/21/2022        Hearing Screen:  Passed both ears on re-screen    02/23/22 0900   CCHD Screening Results: Pass  Mother Name: Jack Barajas   Mother MRN: [id number]      Hospital Course:        Infant is taking  formula well, Percent Weight Change Since Birth: -2.9 acceptable. Jaundice levels acceptable with TCB Result: 12.8 (02/23/22 0450) Infant Age at time of TCB (hr): 32 (02/23/22 0450). Infant voided and stooled in the first 24 hours and normally since.     -Mother had gestational thrombocytopenia; CBC in newborn at 24 HOL was reassuring with Platelet count slightly low at 128 but appropriate. WBC was normal at 22 with reassuring I:T ratio of 0.03. Consider repeat CBC outpatient in a few days to a week to ensure normalization of platelet count.    High risk protocol:  - completed hypoglycemia protocol for LGA. BG reassuring x3    Consults ordered during nursery stay:  None               HPI   10 lb 10 oz (4819 g) [redacted]w[redacted]d  male born via Vaginal, Spontaneous to a 54 y.o.  [id number]  mother who is O+, baby O+, coombs -, GBS negative, serology negative and pregnancy negative for GDM, PIH, PTL or abnormal ultrasounds.     

## 2020-10-01 NOTE — Progress Notes (Signed)
Awaiting home oxygen to be delivered to pt room

## 2020-10-19 NOTE — Discharge Summary (Signed)
Physician Discharge Summary  HENRY BERNE III [id number] DOB: [birth year] DOA: 09/21/2020  PCP: [facility] date: 09/21/2020 Discharge date: 10/01/2020  Admitted From: home Disposition:  home  Recommendations for Outpatient Follow-up:  1. Follow up with PCP in 1-2 weeks 2. Please obtain BMP/CBC in one week 3. Please follow up on patient's need for supplemental oxygen 4. Please follow up on patient's blood glucose and need for insulin.  Due to steroid-induced hyperglycemia with treating Covid-19 pneumonia, patient was discharged on insulin.  This will need to be adjusted as his steroid dose comes down.  Home Health: RN  Equipment/Devices: Oxygen    Discharge Condition: Stable  CODE STATUS: Full  Diet recommendation: Carb Modified     Discharge Diagnoses: Active Problems:   Pneumonia due to COVID-19 virus   T2DM (type 2 diabetes mellitus) ([place])   Obesity    Summary of HPI and Hospital Course:  55 year old male with obesity, HTN, DM 2, came into the hospital with cough, shortness of breath.  His symptoms initially started on 12/28, and he actually tested positive for Covid on 12/30.  Due to worsening of his symptoms and progressive shortness of breath he came into the hospital and was admitted on 1/5.  Chest x-ray on admission showed bilateral infiltrates    Acute respiratory failure with hypoxia secondary to COVID-19 pneumonia -present on admission. Continues to require 12 L/min high flow nasal cannula oxygen. CRP has normalized. Patient had persistently tenuous respiratory status with O2 desaturation with minimal exertion, talking.  This slowly improved.   Given IV Lasix on 1/10, 1/11. Treated with  5 days remdesivir, IV steroids, baricitinib Supportive care with Combivent scheduled, Breo, antitussives Pt encouraged to rone or side lay as much as possible to improve oxygenation, and use incentive spirometry, flutter valve Discharged with 10 day prednisone  taper given ongoing hypoxia. Home oxygen was set up, requiring 4 L/min with ambulation.  Steroid-induced hyperglycemia Type 2 diabetes - last A1c was 7%. Insulin was up-titrated while on IV steroids with postprandial hyperglycemia.  Patient requires insulin on discharge.  He was educated and trained on its use prior to discharge. Lantus and mealtime Novolog (sliding scale as well) Very close PCP follow up.  Transaminitis -likely due to COVID infection.  Improved.  Repeat CMP in follow up.  Obesity: Body mass index is 36.94 kg/m.  Complicates overall care and prognosis.   Discharge Instructions   Discharge Instructions    Call MD for:   Complete by: As directed    Worsening shortness of breath, need for higher amounts of oxygen (above 4-6 L/min for extended length of time).  Blood sugars below 80 or high blood sugars that consistently stay over 200.   Call MD for:  extreme fatigue   Complete by: As directed    Call MD for:  persistant dizziness or light-headedness   Complete by: As directed    Call MD for:  persistant nausea and vomiting   Complete by: As directed    Call MD for:  severe uncontrolled pain   Complete by: As directed    Call MD for:  temperature >100.4   Complete by: As directed    Discharge instructions   Complete by: As directed    Oxygen -  Continue using oxygen as needed to keep your oxygen level between 88-93%. You should be able to turn down the amount of oxygen over the next several days, but for some patients it can take longer to  wean completely off oxygen.  If you start seeing oxygen levels that stay in low 80's or lower, and it does not come up when you rest and do deep breathing through your nose, then you need to return to the ER.   Insulin -  Levemir (aka insulin detemir) is LONG acting that you take at bedtime. Novolog (aka insulin aspart) is SHORT acting that you take with meals.  If you don't eat at least half your usual meal, you should  not give yourself this insulin because it could drop your sugar too low.  Write down all your blood sugars and how many units of insulin you're using.  This will help your primary care doctor know what adjustments to make.  As your steroid dose is coming down, you should need less insulin.  This needs to be monitored closely.  Call your primary care doctor if you have questions about using insulin.  Call your primary care doctor's office on Monday morning to schedule close follow up visit within a week.   Increase activity slowly   Complete by: As directed      Allergies as of 10/01/2020   No Known Allergies     Medication List    STOP taking these medications   doxycycline 100 MG capsule Commonly known as: VIBRAMYCIN     TAKE these medications   fluticasone 50 MCG/ACT nasal spray Commonly known as: FLONASE Place 1 spray into both nostrils daily. Start taking on: October 02, 2020   insulin aspart 100 UNIT/ML FlexPen Commonly known as: NOVOLOG Inject 14 Units into the skin 3 (three) times daily with meals.   insulin detemir 100 UNIT/ML FlexPen Commonly known as: LEVEMIR Inject 23 Units into the skin daily.   Ipratropium-Albuterol 20-100 MCG/ACT Aers respimat Commonly known as: COMBIVENT Inhale 1 puff into the lungs every 6 (six) hours.   lovastatin 10 MG tablet Commonly known as: MEVACOR Take 10 mg by mouth at bedtime.   metFORMIN 500 MG 24 hr tablet Commonly known as: GLUCOPHAGE-XR Take 500 mg by mouth at bedtime.   multivitamin with minerals Tabs tablet Take 1 tablet by mouth daily.   Pen Needles 30G X 8 MM Misc 1 each by Does not apply route 4 (four) times daily -  with meals and at bedtime.   predniSONE 10 MG tablet Commonly known as: DELTASONE Take 5 tablets (50 mg total) by mouth daily with breakfast for 2 days, THEN 4 tablets (40 mg total) daily with breakfast for 2 days, THEN 3 tablets (30 mg total) daily with breakfast for 2 days, THEN 2 tablets (20 mg  total) daily with breakfast for 2 days, THEN 1 tablet (10 mg total) daily with breakfast for 2 days. Start taking on: October 01, 2020   promethazine-dextromethorphan 6.25-15 MG/5ML syrup Commonly known as: PROMETHAZINE-DM Take 5 mLs by mouth at bedtime as needed for cough.   sodium chloride 0.65 % Soln nasal spray Commonly known as: OCEAN Place 1 spray into both nostrils as needed for congestion.       No Known Allergies  Consultations:  None   Procedures/Studies: DG Chest 2 View  Result Date: 09/21/2020 CLINICAL DATA:  Dyspnea EXAM: CHEST - 2 VIEW COMPARISON:  09/15/2020 FINDINGS: Lung volumes are small and pulmonary insufflation has diminished since prior examination. Superimposed bilateral mid and lower lung zone patchy pulmonary infiltrates have developed, likely infectious in etiology. No pneumothorax or pleural effusion. Cardiac size within normal limits. No acute bone abnormality. IMPRESSION: Interval development  of bilateral pulmonary infiltrates, likely infectious. Progressive pulmonary hypoinflation. Electronically Signed   By: Fidela Salisbury MD   On: 09/21/2020 05:43   DG Chest 2 View  Result Date: 09/15/2020 CLINICAL DATA:  Cough for 3-4 months EXAM: CHEST - 2 VIEW COMPARISON:  03/21/2018 FINDINGS: Patchy left lower lobe airspace disease which may reflect atelectasis versus pneumonia. No pleural effusion or pneumothorax. Heart and mediastinal contours are unremarkable. No acute osseous abnormality. IMPRESSION: Patchy left lower lobe airspace disease which may reflect atelectasis versus pneumonia. Electronically Signed   By: Kathreen Devoid   On: 09/15/2020 14:33   DG Chest Port 1 View  Result Date: 09/30/2020 CLINICAL DATA:  Hypoxia.  COVID-19 positive EXAM: PORTABLE CHEST 1 VIEW COMPARISON:  September 27, 2020 FINDINGS: There is airspace opacity in the right upper lobe as well as in both mid and lower lung regions, similar to most recent study. Heart is borderline enlarged  with pulmonary vascularity normal. No adenopathy. No bone lesions. IMPRESSION: Multifocal airspace opacity, likely due to atypical organism pneumonia. Appearance similar to recent study. Stable cardiac silhouette. Electronically Signed   By: Lowella Grip III M.D.   On: 09/30/2020 08:30   DG Chest Port 1 View  Result Date: 09/27/2020 CLINICAL DATA:  COVID.  Shortness of breath EXAM: PORTABLE CHEST 1 VIEW COMPARISON:  09/21/2020. FINDINGS: Pneumomediastinum cannot be excluded. No pneumothorax noted. Cardiomegaly. No pulmonary venous congestion. Low lung volumes. Patchy bilateral pulmonary infiltrates are again. Slight progression from prior exam cannot be excluded. No pleural effusion or pneumothorax. Degenerative change thoracic spine. IMPRESSION: 1.  Pneumomediastinum cannot be excluded. 2. Patchy bilateral pulmonary infiltrates are again noted. Slight progression from prior exam cannot be excluded. Electronically Signed   By: Marcello Moores  Register   On: 09/27/2020 14:32       Subjective: Pt up in recliner chair.  Says he feels great.  No SOB when up to ambulate to bathroom.  Ready to get home finally.     Discharge Exam: Vitals:   10/01/20 0527 10/01/20 0730  BP: 133/74 (!) 136/93  Pulse: 80 73  Resp: 16 18  Temp: (!) 97.5 F (36.4 C) 98.4 F (36.9 C)  SpO2: 90% 93%   Vitals:   10/01/20 0102 10/01/20 0404 10/01/20 0527 10/01/20 0730  BP:   133/74 (!) 136/93  Pulse: 86 66 80 73  Resp:   16 18  Temp:   (!) 97.5 F (36.4 C) 98.4 F (36.9 C)  TempSrc:   Oral Oral  SpO2: 94% 96% 90% 93%  Weight:      Height:        General: Pt is alert, awake, not in acute distress Cardiovascular: RRR, S1/S2 +, no rubs, no gallops Respiratory: CTA bilaterally, no wheezing, no rhonchi, on 4 L/min oxygen [place] Abdominal: Soft, NT, ND, bowel sounds + Extremities: no edema, no cyanosis    The results of significant diagnostics from this hospitalization (including imaging, microbiology, ancillary  and laboratory) are listed below for reference.     Microbiology: No results found for this or any previous visit (from the past 240 hour(s)).   Labs: BNP (last 3 results) Recent Labs    09/30/20 0409  BNP 30.8   Basic Metabolic Panel: Recent Labs  Lab 09/25/20 0608 09/26/20 0413 09/27/20 0428 09/28/20 0603 09/29/20 0555 09/30/20 0409 10/01/20 0522  NA 140 137 136 137 135 137 136  K 4.8 5.0 5.3* 4.8 4.9 4.8 5.0  CL 100 99 96* 100 99 99 99  CO2 30 26 29 28 27 27 31   GLUCOSE 246* 255* 249* 241* 225* 191* 228*  BUN 25* 24* 25* 25* 22* 21* 19  CREATININE 0.78 0.66 0.90 0.83 0.87 0.71 0.65  CALCIUM 9.8 9.5 9.6 9.3 9.2 8.7* 9.2  MG 2.4 2.4  --   --   --  2.3  --   PHOS 3.4 3.2  --   --   --   --   --    Liver Function Tests: Recent Labs  Lab 09/27/20 0428 09/28/20 0603 09/29/20 0555 09/30/20 0409 10/01/20 0522  AST 39 30 25 26  33  ALT 87* 73* 64* 59* 68*  ALKPHOS 54 48 45 43 44  BILITOT 0.8 0.7 0.8 0.7 0.8  PROT 7.2 6.5 6.4* 6.4* 6.4*  ALBUMIN 3.5 3.1* 3.1* 3.0* 3.1*   No results for input(s): LIPASE, AMYLASE in the last 168 hours. No results for input(s): AMMONIA in the last 168 hours. CBC: Recent Labs  Lab 09/25/20 0608 09/26/20 0413 09/27/20 0428 09/28/20 0603 09/29/20 0555 09/30/20 0409  WBC 13.6* 15.7* 18.7* 15.2* 15.5* 15.5*  NEUTROABS 9.9* 12.0*  --   --   --   --   HGB 13.8 13.6 14.4 13.6 14.0 13.7  HCT 43.4 42.7 44.4 42.1 42.5 42.7  MCV 90.8 89.3 90.2 88.8 89.7 89.9  PLT 467* 491* 501* 464* 412* 369   Cardiac Enzymes: No results for input(s): CKTOTAL, CKMB, CKMBINDEX, TROPONINI in the last 168 hours. BNP: Invalid input(s): POCBNP CBG: Recent Labs  Lab 09/30/20 0757 09/30/20 1140 09/30/20 1534 09/30/20 2021 10/01/20 0823  GLUCAP 186* 282* 275* 286* 200*   D-Dimer No results for input(s): DDIMER in the last 72 hours. Hgb A1c No results for input(s): HGBA1C in the last 72 hours. Lipid Profile No results for input(s): CHOL, HDL,  LDLCALC, TRIG, CHOLHDL, LDLDIRECT in the last 72 hours. Thyroid function studies No results for input(s): TSH, T4TOTAL, T3FREE, THYROIDAB in the last 72 hours.  Invalid input(s): FREET3 Anemia work up No results for input(s): VITAMINB12, FOLATE, FERRITIN, TIBC, IRON, RETICCTPCT in the last 72 hours. Urinalysis No results found for: COLORURINE, APPEARANCEUR, LABSPEC, [place], GLUCOSEU, HGBUR, BILIRUBINUR, KETONESUR, PROTEINUR, UROBILINOGEN, NITRITE, LEUKOCYTESUR Sepsis Labs Invalid input(s): PROCALCITONIN,  WBC,  LACTICIDVEN Microbiology No results found for this or any previous visit (from the past 240 hour(s)).   Time coordinating discharge: Over 30 minutes  SIGNED:   Ezekiel Slocumb, DO [facility] 10/01/2020, 12:01 PM   If 7PM-7AM, please contact night-coverage [email or web address]

## 2021-01-09 ENCOUNTER — Encounter: Admission: RE | Payer: Self-pay | Source: Ambulatory Visit

## 2021-01-09 ENCOUNTER — Ambulatory Visit: Admission: RE | Admit: 2021-01-09 | Payer: 59 | Source: Ambulatory Visit | Admitting: Internal Medicine

## 2021-01-09 SURGERY — COLONOSCOPY WITH PROPOFOL
Anesthesia: General

## 2021-04-04 DIAGNOSIS — I10 Essential (primary) hypertension: Secondary | ICD-10-CM | POA: Insufficient documentation

## 2021-05-09 ENCOUNTER — Encounter (INDEPENDENT_AMBULATORY_CARE_PROVIDER_SITE_OTHER): Payer: 59 | Admitting: Internal Medicine

## 2021-05-09 DIAGNOSIS — G4733 Obstructive sleep apnea (adult) (pediatric): Secondary | ICD-10-CM | POA: Diagnosis not present

## 2021-05-12 DIAGNOSIS — G4733 Obstructive sleep apnea (adult) (pediatric): Secondary | ICD-10-CM | POA: Insufficient documentation

## 2021-05-12 NOTE — Procedures (Signed)
[place] Report Part I  Phone: [phone] Fax: [phone]  Patient Name: Jack Barajas, Jack Barajas Acquisition Number: [id number]  Date of Birth: July 06, 1966 Acquisition Date: 05/09/2021  Referring Physician: Toni Arthurs, NP     History: The patient is a 55 year old male with obstructive sleep apnea for CPAP titration. Medical History: diabetes, sleep apnea, hypercholesterolemia.  Medications: Metformin, Lovastatin, Trelegy, Ozempic, Zyrtec, Flonase, Novolog, Lantus.  Procedure: This routine overnight polysomnogram was performed on the [place] 5 using the standard CPAP protocol. This included 6 channels of EEG, 2 channels of EOG, chin EMG, bilateral anterior tibialis EMG, nasal/oral thermistor, PTAF (nasal pressure transducer), chest and abdominal wall movements, EKG, and pulse oximetry.  Description: The total recording time was 441.1 minutes. The total sleep time was 392.1 minutes. There were a total of 37.0 minutes of wakefulness after sleep onset for a slightly reducedsleep efficiency of 88.9%. The latency to sleep onset was within normal limitsat 12.0 minutes. The R sleep onset latency was within normal limits at 106.0 minutes. Sleep parameters, as a percentage of the total sleep time, demonstrated 3.1% of sleep was in N1 sleep, 37.0% N2, 26.4% N3 and 33.5% R sleep. There were a total of 95 arousals for an arousal index of 14.5 arousals per hour of sleep that was slightly elevated.  Overall, there were a total of 161 respiratory events for a respiratory disturbance index, which includes apneas, hypopneas and RERAs (increased respiratory effort) of 24.6 respiratory events per hour of sleep during the pressure titration. CPAP was initiated at 5 cm H2O at lights out, 9:38 p.m. It was titrated in 1-2 cm increments for obstructive events to the final pressure of 14 cm H2O. After initial transitional respiratory events, the apnea was controlled at the final pressure  and REM sleep was observed. Supine sleep was not observed.  Additionally, the baseline oxygen saturation during wakefulness was 95%, during NREM sleep averaged 95%, and during REM sleep averaged 95%. The total duration of oxygen < 90% was 10.8 minutes and <80% was 0.0 minutes.  Cardiac monitoring- There were no significant cardiac rhythm irregularities.   Periodic limb movement monitoring- demonstrated that there were 189 periodic limb movements for a periodic limb movement index of 28.9 periodic limb movements per hour of sleep.   Impression: This patient's obstructive sleep apnea demonstrated significant improvement with the utilization of nasal CPAP at 14 cm H2O.   There was a significantly elevated periodic limb movement index of 28.9 periodic limb movements per hour of sleep. Treatment may be indicated if sleep disruption or sleepiness persist once the patient is fully compliant with CPAP.  Recommendations: Would recommend utilization of nasal CPAP at 14 cm oH2O.      A [employer] Simplus mask, size large, was used. Chin strap used during study- no. Humidifier used during study- yes.     Allyne Gee, MD, [facility] Diplomate ABMS-Pulmonary, Critical Care and Sleep Medicine  Electronically reviewed and digitally signed    [place] CPAP/BIPAP Polysomnogram Report Part II Phone: [phone] Fax: [phone]  Patient last name [place] Barajas Neck Size 17.0 in. Acquisition [id number]  Patient first name Jack Weight 290.0 lbs. Started 05/09/2021 at 9:34:27 PM  Birth date 08/28/1956 Height 72.0 in. Stopped 05/10/2021 at 5:00:51 AM  Age 55      Type Adult BMI 39.3 lb/in2 Duration 441.1  Report generated by: Adriana Mccallum, RPSGT Sleep Data: Lights Out: 9:38:57 PM Sleep Onset:  9:50:57 PM  Lights On: 5:00:03 AM Sleep Efficiency: 88.9 %  Total Recording Time: 441.1 min Sleep Latency (from Lights Off) 12.0 min  Total Sleep Time (TST): 392.1 min R Latency (from Sleep Onset):  106.0 min  Sleep Period Time: 429.1 min Total number of awakenings: 18  Wake during sleep: 37.0 min Wake After Sleep Onset (WASO): 37.0 min   Sleep Data:         Arousal Summary: Stage  Latency from lights out (min) Latency from sleep onset (min) Duration (min) % Total Sleep Time  Normal values  N 1 12.0 0.0 12.0 3.1 (5%)  N 2 14.0 2.0 145.1 37.0 (50%)  N 3 104.5 92.5 103.5 26.4 (20%)  R 118.0 106.0 131.5 33.5 (25%)    Number Index  Spontaneous 23 3.5  Apneas & Hypopneas 76 11.6  RERAs 3 0.5       (Apneas & Hypopneas & RERAs)  (79) (12.1)  Limb Movement 9 1.4  Snore 0 0.0  TOTAL 111 17.0     Respiratory Data:  CA OA MA Apnea Hypopnea* A+ H RERA Total  Number 0 12 0 12 146 158 3 161  Mean Dur (sec) 0.0 15.4 0.0 15.4 27.3 26.4 33.7 26.5  Max Dur (sec) 0.0 23.0 0.0 23.0 63.5 63.5 38.5 63.5  Total Dur (min) 0.0 3.1 0.0 3.1 66.4 69.5 1.7 71.2  % of TST 0.0 0.8 0.0 0.8 16.9 17.7 0.4 18.2  Index (#/h TST) 0.0 1.8 0.0 1.8 22.3 24.2 0.5 24.6  *Hypopneas scored based on 4% or greater desaturation.  Sleep Stage:         REM NREM TST  AHI 9.1 31.8 24.2  RDI 9.1 32.5 24.6    Sleep (min) TST (%) REM (min) NREM (min) CA (#) OA (#) MA (#) HYP (#) AHI (#/h) RERA (#) RDI (#/h) Desat (#)  Supine    0.00                      0 0.00     Non-Supine 392.10 100.00 131.50 260.60 0.00 12.00 0.00 146.00 24.18 3.00 24.64 187.00  Left: 392.1 100.00 131.5 260.6 0 12 0 146 24.2 3 24.6 187     Snoring: Total number of snoring episodes  0  Total time with snoring    min (   % of sleep)   Oximetry Distribution:             WK REM NREM TOTAL  Average (%)   95 95 95 95  < 90% 1.6 3.1 6.1 10.8  < 80% 0.0 0.0 0.0 0.0  < 70% 0.0 0.0 0.0 0.0  # of Desaturations* 12 26 150 188  Desat Index (#/hour) 14.7 11.9 34.5 28.8  Desat Max (%) '12 7 18 18  '$ Desat Max Dur (sec) 52.0 77.0 117.0 117.0  Approx Min O2 during sleep 79  Approx min O2 during a respiratory event 79  Was Oxygen added  (Y/N) and final rate No:   0 LPM  *Desaturations based on 3% or greater drop from baseline.   Cheyne Stokes Breathing: None Present    Heart Rate Summary:  Average Heart Rate During Sleep 67.6 bpm      Highest Heart Rate During Sleep (95th %) 76.0 bpm      Highest Heart Rate During Sleep 158 bpm (artifact)  Highest Heart Rate During Recording (TIB) 158 bpm (artifact)   Heart Rate Observations: Event Type # Events  Bradycardia 0 Lowest HR Scored: N/A  Sinus Tachycardia During Sleep 0 Highest HR Scored: N/A  Narrow Complex Tachycardia 0 Highest HR Scored: N/A  Wide Complex Tachycardia 0 Highest HR Scored: N/A  Asystole 0 Longest Pause: N/A  Atrial Fibrillation 0 Duration Longest Event: N/A  Other Arrythmias  No Type:   Periodic Limb Movement Data: (Primary legs unless otherwise noted) Total # Limb Movement 201 Limb Movement Index 30.8  Total # PLMS 189 PLMS Index 28.9  Total # PLMS Arousals 7 PLMS Arousal Index 1.1  Percentage Sleep Time with PLMS 76.36mn (19.5 % sleep)  Mean Duration limb movements (secs) 917.0    IPAP Level (cmH2O) EPAP Level (cmH2O) Total Duration (min) Sleep Duration (min) Sleep (%) REM (%) CA  #) OA # MA # HYP #) AHI (#/hr) RERAs # RERAs (#/hr) RDI (#/hr)  5 5 4.9 3.9 79.6 0.0 0 1 0 7 123.1 0 0.0 123.'1  6 6 '$ 15.6 15.1 96.8 0.0 0 4 0 23 107.3 0 0.0 107.'3  7 7 '$ 27.7 16.2 58.5 0.0 0 5 0 22 100.0 0 0.0 100.0  8 8 32.0 25.5 79.7 0.0 0 0 0 39 91.8 0 0.0 91.'8  10 10 '$ 37.9 35.9 94.7 35.6 0 0 0 28 46.8 1 1.7 48.'5  11 11 '$ 30.3 30.3 100.0 100.0 0 0 0 6 11.9 0 0.0 11.'9  12 12 '$ 80.7 70.7 87.6 3.8 0 2 0 9 9.3 2 1.7 11.0  13 13 69.1 68.1 98.6 37.6 0 0 0 7 6.2 0 0.0 6.'2  14 14 '$ 128.2 123.7 96.5 44.9 0 0 0 2 1.0 0 0.0 1.0

## 2021-08-09 ENCOUNTER — Encounter: Payer: Self-pay | Admitting: Gastroenterology

## 2021-08-13 NOTE — H&P (Signed)
Pre-Procedure H&P   Patient ID: Jack Barajas is a 55 y.o. male.  Gastroenterology Provider: Annamaria Helling, DO  Referring Provider: Octavia Bruckner, PA PCP: Jack Barajas  Date: 08/14/2021  HPI Mr. Jack Barajas is a 55 y.o. male who presents today for Colonoscopy for initial screening colonoscopy, +Fhx colon polyps.  Normal qd bm, no blood/diarrhea/constipation. Rare nsaid use.  Pt's brother with polyps in his 47s. Low risk per cardiologist for procedure with normal echo and stress test. No longer with chest pain. No long with post covid issues. Followed by Pulmonology.  Hgb 13.8, mcv 90. A1c 7 cr 1.0.  Patient denies nausea, vomiting, coffee ground emesis, hematemesis, abdominal pain, diarrhea, constipation, melena, hematochezia, fever, chills, dysphagia, odynophagia, jaundice  Past Medical History:  Diagnosis Date   Diabetes mellitus without complication (HCC)    Dyspnea    GERD (gastroesophageal reflux disease)    Hypertension    Sleep apnea     Past Surgical History:  Procedure Laterality Date   TOE SURGERY     TONSILLECTOMY     WRIST SURGERY      Family History Brother- colon polyps- 73s No other h/o GI disease or malignancy  Review of Systems  Constitutional:  Negative for activity change, appetite change, chills, diaphoresis, fatigue, fever and unexpected weight change.  HENT:  Negative for trouble swallowing and voice change.   Respiratory:  Negative for shortness of breath and wheezing.   Cardiovascular:  Negative for chest pain and palpitations.  Gastrointestinal:  Negative for abdominal distention, abdominal pain, anal bleeding, blood in stool, constipation, diarrhea, nausea and vomiting.  Musculoskeletal:  Negative for arthralgias and myalgias.  Skin:  Negative for color change and pallor.  Neurological:  Negative for dizziness, syncope and weakness.  Psychiatric/Behavioral:  Negative for confusion. The patient is not  nervous/anxious.   All other systems reviewed and are negative.   Medications No current facility-administered medications on file prior to encounter.   Current Outpatient Medications on File Prior to Encounter  Medication Sig Dispense Refill   Fluticasone-Umeclidin-Vilant (TRELEGY ELLIPTA) 100-62.5-25 MCG/ACT AEPB Inhale 1 puff into the lungs daily.     glucosamine-chondroitin 500-400 MG tablet Take 1 tablet by mouth daily.     OXYGEN Inhale 2 L into the lungs daily.     fluticasone (FLONASE) 50 MCG/ACT nasal spray Place 1 spray into both nostrils daily. 9.9 mL 1   insulin aspart (NOVOLOG) 100 UNIT/ML FlexPen Inject 14 Units into the skin 3 (three) times daily with meals. 15 mL 11   insulin detemir (LEVEMIR) 100 UNIT/ML FlexPen Inject 23 Units into the skin daily. 15 mL 1   Insulin Pen Needle (PEN NEEDLES) 30G X 8 MM MISC 1 each by Does not apply route 4 (four) times daily -  with meals and at bedtime. 100 each 1   Ipratropium-Albuterol (COMBIVENT) 20-100 MCG/ACT AERS respimat Inhale 1 puff into the lungs every 6 (six) hours. 4 g 1   lovastatin (MEVACOR) 10 MG tablet Take 10 mg by mouth at bedtime.     metFORMIN (GLUCOPHAGE-XR) 500 MG 24 hr tablet Take 500 mg by mouth at bedtime.     Multiple Vitamin (MULTIVITAMIN WITH MINERALS) TABS tablet Take 1 tablet by mouth daily.     promethazine-dextromethorphan (PROMETHAZINE-DM) 6.25-15 MG/5ML syrup Take 5 mLs by mouth at bedtime as needed for cough. 118 mL 0   sodium chloride (OCEAN) 0.65 % SOLN nasal spray Place 1 spray into both nostrils as  needed for congestion. 30 mL 0   [DISCONTINUED] albuterol (PROVENTIL HFA;VENTOLIN HFA) 108 (90 Base) MCG/ACT inhaler Inhale 2 puffs into the lungs every 4 (four) hours as needed. 1 Inhaler 0    Pertinent medications related to GI and procedure were reviewed by me with the patient prior to the procedure   Current Facility-Administered Medications:    0.9 %  sodium chloride infusion, , Intravenous,  Continuous, Jack Helling, DO, Last Rate: 20 mL/hr at 08/14/21 0908, New Bag at 08/14/21 0908      No Known Allergies Allergies were reviewed by me prior to the procedure  Objective    Vitals:   08/14/21 0846  BP: (!) 139/109  Pulse: 93  Resp: 18  Temp: (!) 97.3 F (36.3 C)  TempSrc: Temporal  SpO2: 97%  Weight: 133.8 kg  Height: 6' (1.829 m)     Physical Exam Vitals and nursing note reviewed.  Constitutional:      General: He is not in acute distress.    Appearance: Normal appearance. He is obese. He is not ill-appearing, toxic-appearing or diaphoretic.  HENT:     Head: Normocephalic and atraumatic.     Nose: Nose normal.     Mouth/Throat:     Mouth: Mucous membranes are moist.     Pharynx: Oropharynx is clear.  Eyes:     General: No scleral icterus.    Extraocular Movements: Extraocular movements intact.  Cardiovascular:     Rate and Rhythm: Normal rate and regular rhythm.     Heart sounds: Normal heart sounds. No murmur heard.   No friction rub. No gallop.  Pulmonary:     Effort: Pulmonary effort is normal. No respiratory distress.     Breath sounds: Normal breath sounds. No wheezing, rhonchi or rales.  Abdominal:     General: Bowel sounds are normal. There is no distension.     Palpations: Abdomen is soft.     Tenderness: There is no abdominal tenderness. There is no guarding or rebound.  Musculoskeletal:     Cervical back: Neck supple.     Right lower leg: No edema.     Left lower leg: No edema.  Skin:    General: Skin is warm and dry.     Coloration: Skin is not jaundiced or pale.  Neurological:     General: No focal deficit present.     Mental Status: He is alert and oriented to person, place, and time. Mental status is at baseline.  Psychiatric:        Mood and Affect: Mood normal.        Behavior: Behavior normal.        Thought Content: Thought content normal.        Judgment: Judgment normal.     Assessment:  [name] is a 55 y.o. male  who presents today for Colonoscopy for initial screening, fhx of colon polyps.  Plan:  Colonoscopy with possible intervention today  Colonoscopy with possible biopsy, control of bleeding, polypectomy, and interventions as necessary has been discussed with the patient/patient representative. Informed consent was obtained from the patient/patient representative after explaining the indication, nature, and risks of the procedure including but not limited to death, bleeding, perforation, missed neoplasm/lesions, cardiorespiratory compromise, and reaction to medications. Opportunity for questions was given and appropriate answers were provided. Patient/patient representative has verbalized understanding is amenable to undergoing the procedure.   Jack Helling, DO  [facility]  Portions of the  record may have been created with voice recognition software. Occasional wrong-word or 'sound-a-like' substitutions may have occurred due to the inherent limitations of voice recognition software.  Read the chart carefully and recognize, using context, where substitutions may have occurred.

## 2021-08-14 ENCOUNTER — Ambulatory Visit
Admission: RE | Admit: 2021-08-14 | Discharge: 2021-08-14 | Disposition: A | Discharge status: Home / Self Care | Payer: 59 | Attending: Gastroenterology | Admitting: Gastroenterology

## 2021-08-14 ENCOUNTER — Encounter: Payer: Self-pay | Admitting: Gastroenterology

## 2021-08-14 ENCOUNTER — Ambulatory Visit: Payer: 59 | Admitting: Anesthesiology

## 2021-08-14 ENCOUNTER — Encounter
Admission: RE | Disposition: A | Discharge status: Home / Self Care | Payer: Self-pay | Source: Home / Self Care | Attending: Gastroenterology

## 2021-08-14 DIAGNOSIS — D122 Benign neoplasm of ascending colon: Secondary | ICD-10-CM | POA: Insufficient documentation

## 2021-08-14 DIAGNOSIS — D124 Benign neoplasm of descending colon: Secondary | ICD-10-CM | POA: Diagnosis not present

## 2021-08-14 DIAGNOSIS — G4730 Sleep apnea, unspecified: Secondary | ICD-10-CM | POA: Insufficient documentation

## 2021-08-14 DIAGNOSIS — E669 Obesity, unspecified: Secondary | ICD-10-CM | POA: Insufficient documentation

## 2021-08-14 DIAGNOSIS — Z6841 Body Mass Index (BMI) 40.0 and over, adult: Secondary | ICD-10-CM | POA: Insufficient documentation

## 2021-08-14 DIAGNOSIS — Z8371 Family history of colonic polyps: Secondary | ICD-10-CM | POA: Insufficient documentation

## 2021-08-14 DIAGNOSIS — D123 Benign neoplasm of transverse colon: Secondary | ICD-10-CM | POA: Insufficient documentation

## 2021-08-14 DIAGNOSIS — K219 Gastro-esophageal reflux disease without esophagitis: Secondary | ICD-10-CM | POA: Insufficient documentation

## 2021-08-14 DIAGNOSIS — E119 Type 2 diabetes mellitus without complications: Secondary | ICD-10-CM | POA: Diagnosis not present

## 2021-08-14 DIAGNOSIS — I10 Essential (primary) hypertension: Secondary | ICD-10-CM | POA: Insufficient documentation

## 2021-08-14 DIAGNOSIS — K640 First degree hemorrhoids: Secondary | ICD-10-CM | POA: Insufficient documentation

## 2021-08-14 DIAGNOSIS — Z1211 Encounter for screening for malignant neoplasm of colon: Secondary | ICD-10-CM | POA: Insufficient documentation

## 2021-08-14 HISTORY — DX: Sleep apnea, unspecified: G47.30

## 2021-08-14 HISTORY — DX: Type 2 diabetes mellitus without complications: E11.9

## 2021-08-14 HISTORY — PX: COLONOSCOPY: SHX5424

## 2021-08-14 HISTORY — DX: Dyspnea, unspecified: R06.00

## 2021-08-14 HISTORY — DX: Gastro-esophageal reflux disease without esophagitis: K21.9

## 2021-08-14 LAB — GLUCOSE, CAPILLARY: Glucose-Capillary: 149 mg/dL — ABNORMAL HIGH (ref 70–99)

## 2021-08-14 SURGERY — COLONOSCOPY
Anesthesia: General

## 2021-08-14 MED ORDER — SPOT INK MARKER SYRINGE KIT
PACK | SUBMUCOSAL | Status: DC | PRN
  Administered 2021-08-14: 1.5 mL via SUBMUCOSAL

## 2021-08-14 MED ORDER — LIDOCAINE HCL (CARDIAC) PF 100 MG/5ML IV SOSY
PREFILLED_SYRINGE | INTRAVENOUS | Status: DC | PRN
  Administered 2021-08-14: 50 mg via INTRAVENOUS

## 2021-08-14 MED ORDER — LIDOCAINE HCL (PF) 2 % IJ SOLN
INTRAMUSCULAR | Status: AC
  Filled 2021-08-14: qty 5

## 2021-08-14 MED ORDER — PROPOFOL 10 MG/ML IV BOLUS
INTRAVENOUS | Status: DC | PRN
  Administered 2021-08-14: 80 mg via INTRAVENOUS

## 2021-08-14 MED ORDER — PROPOFOL 500 MG/50ML IV EMUL
INTRAVENOUS | Status: AC
  Filled 2021-08-14: qty 100

## 2021-08-14 MED ORDER — SODIUM CHLORIDE 0.9 % IV SOLN: INTRAVENOUS | Status: DC

## 2021-08-14 MED ORDER — PROPOFOL 500 MG/50ML IV EMUL
INTRAVENOUS | Status: DC | PRN
  Administered 2021-08-14: 165 ug/kg/min via INTRAVENOUS

## 2021-08-14 NOTE — Anesthesia Procedure Notes (Signed)
Date/Time: 08/14/2021 9:37 AM Performed by: Johnna Acosta, CRNA Pre-anesthesia Checklist: Patient identified, Emergency Drugs available, Suction available, Patient being monitored and Timeout performed Patient Re-evaluated:Patient Re-evaluated prior to induction Oxygen Delivery Method: Nasal cannula Preoxygenation: Pre-oxygenation with 100% oxygen Induction Type: IV induction

## 2021-08-14 NOTE — Interval H&P Note (Signed)
History and Physical Interval Note: Preprocedure H&P from 08/14/21  was reviewed and there was no interval change after seeing and examining the patient.  Written consent was obtained from the patient after discussion of risks, benefits, and alternatives. Patient has consented to proceed with Colonoscopy with possible intervention   08/14/2021 9:32 AM  Jack Barajas  has presented today for surgery, with the diagnosis of Family history of polyps in the colon (Z83.71).  The various methods of treatment have been discussed with the patient and family. After consideration of risks, benefits and other options for treatment, the patient has consented to  Procedure(s): COLONOSCOPY (N/A) as a surgical intervention.  The patient's history has been reviewed, patient examined, no change in status, stable for surgery.  I have reviewed the patient's chart and labs.  Questions were answered to the patient's satisfaction.     Jack Barajas

## 2021-08-14 NOTE — Anesthesia Postprocedure Evaluation (Signed)
Anesthesia Post Note  Patient: Jack Barajas  Procedure(s) Performed: COLONOSCOPY  Patient location during evaluation: PACU Anesthesia Type: General Level of consciousness: awake and alert, oriented and patient cooperative Pain management: pain level controlled Vital Signs Assessment: post-procedure vital signs reviewed and stable Respiratory status: spontaneous breathing, nonlabored ventilation and respiratory function stable Cardiovascular status: blood pressure returned to baseline and stable Postop Assessment: adequate PO intake Anesthetic complications: no   No notable events documented.   Last Vitals:  Vitals:   08/14/21 1050 08/14/21 1058  BP: 110/82 (!) 104/92  Pulse: 91 84  Resp: 17 12  Temp:    SpO2: 96% 95%    Last Pain:  Vitals:   08/14/21 1108  TempSrc:   PainSc: 0-No pain                 Darrin Nipper

## 2021-08-14 NOTE — Op Note (Signed)
[facility] Gastroenterology Patient Name: Jack Barajas Procedure Date: 08/14/2021 9:34 AM MRN: [id number] Account #: [id number] Date of Birth: 06-10-1966 Admit Type: Outpatient Age: 55 Room: [facility] ENDO ROOM 2 Gender: Male Note Status: Finalized Instrument Name: Jasper Riling [id number] Procedure:             Colonoscopy Indications:           Colon cancer screening in patient at increased risk:                         Family history of 1st-degree relative with colon                         polyps before age 42 years Providers:             Rueben Bash, DO Referring MD:          No Local Md, MD (Referring MD) Medicines:             Monitored Anesthesia Care Complications:         No immediate complications. Estimated blood loss:                         Minimal. Procedure:             Pre-Anesthesia Assessment:                        - Prior to the procedure, a History and Physical was                         performed, and patient medications and allergies were                         reviewed. The patient is competent. The risks and                         benefits of the procedure and the sedation options and                         risks were discussed with the patient. All questions                         were answered and informed consent was obtained.                         Patient identification and proposed procedure were                         verified by the physician, the nurse, the anesthetist                         and the technician in the endoscopy suite. Mental                         Status Examination: alert and oriented. Airway                         Examination: normal oropharyngeal airway and neck  mobility. Respiratory Examination: clear to                         auscultation. CV Examination: RRR, no murmurs, no S3                         or S4. Prophylactic Antibiotics: The patient does not                          require prophylactic antibiotics. Prior                         Anticoagulants: The patient has taken no previous                         anticoagulant or antiplatelet agents. ASA Grade                         Assessment: III - A patient with severe systemic                         disease. After reviewing the risks and benefits, the                         patient was deemed in satisfactory condition to                         undergo the procedure. The anesthesia plan was to use                         monitored anesthesia care (MAC). Immediately prior to                         administration of medications, the patient was                         re-assessed for adequacy to receive sedatives. The                         heart rate, respiratory rate, oxygen saturations,                         blood pressure, adequacy of pulmonary ventilation, and                         response to care were monitored throughout the                         procedure. The physical status of the patient was                         re-assessed after the procedure.                        After obtaining informed consent, the colonoscope was                         passed under direct vision. Throughout the procedure,  the patient's blood pressure, pulse, and oxygen                         saturations were monitored continuously. The                         Colonoscope was introduced through the anus and                         advanced to the the terminal ileum, with                         identification of the appendiceal orifice and IC                         valve. The colonoscopy was performed without                         difficulty. The patient tolerated the procedure well.                         The quality of the bowel preparation was evaluated                         using the BBPS [facility] Bowel Preparation Scale) with                         scores of: Right  Colon = 2 (minor amount of residual                         staining, small fragments of stool and/or opaque                         liquid, but mucosa seen well), Transverse Colon = 3                         (entire mucosa seen well with no residual staining,                         small fragments of stool or opaque liquid) and Left                         Colon = 3 (entire mucosa seen well with no residual                         staining, small fragments of stool or opaque liquid).                         The total BBPS score equals 8. The quality of the                         bowel preparation was excellent. The terminal ileum,                         ileocecal valve, appendiceal orifice, and rectum were  photographed. Findings:      The perianal and digital rectal examinations were normal. Pertinent       negatives include normal sphincter tone.      The terminal ileum appeared normal.      Non-bleeding internal hemorrhoids were found during retroflexion. The       hemorrhoids were Grade I (internal hemorrhoids that do not prolapse).       Estimated blood loss: none.      Retroflexion in the right colon was performed. Estimated blood loss:       none.      A 13 to 15 mm, non-bleeding polyp was found in the recto-sigmoid colon       approximately 25 cm from anus. The polyp was semi-pedunculated.       Polypectomy was attempted, but due to angulation and potentially lateral       spreading, was not performed. Estimated blood loss: none. Area was       tattooed with an injection of 1.5 cc of ink.      A 2 to 3 mm polyp was found in the ascending colon. The polyp was       sessile. The polyp was removed with a cold biopsy forceps. Resection and       retrieval were complete. Estimated blood loss was minimal.      A 2 to 3 mm polyp was found in the transverse colon. The polyp was       sessile. The polyp was removed with a cold biopsy forceps. Resection and        retrieval were complete. Estimated blood loss was minimal.      A 4 to 5 mm polyp was found in the transverse colon. The polyp was       sessile. The polyp was removed with a cold snare. Resection and       retrieval were complete. Estimated blood loss was minimal.      A 11 to 14 mm polyp was found in the transverse colon. The polyp was       pedunculated. The polyp was removed with a hot snare. Resection and       retrieval were complete. Estimated blood loss was minimal. To prevent       bleeding after the polypectomy, two hemostatic clips were successfully       placed (MR conditional). There was no bleeding at the end of the       procedure. Estimated blood loss was minimal.      Two sessile polyps were found in the descending colon. The polyps were 4       to 6 mm in size. These polyps were removed with a cold snare. Resection       and retrieval were complete. Estimated blood loss was minimal.      The exam was otherwise without abnormality on direct and retroflexion       views. Impression:            - The examined portion of the ileum was normal.                        - Non-bleeding internal hemorrhoids.                        - One 13 to 15 mm, non-bleeding polyp at the  recto-sigmoid colon. Resection not attempted. Tattooed.                        - One 2 to 3 mm polyp in the ascending colon, removed                         with a cold biopsy forceps. Resected and retrieved.                        - One 2 to 3 mm polyp in the transverse colon, removed                         with a cold biopsy forceps. Resected and retrieved.                        - One 4 to 5 mm polyp in the transverse colon, removed                         with a cold snare. Resected and retrieved.                        - One 11 to 14 mm polyp in the transverse colon,                         removed with a hot snare. Resected and retrieved.                         Clips (MR  conditional) were placed.                        - Two 4 to 6 mm polyps in the descending colon,                         removed with a cold snare. Resected and retrieved.                        - The examination was otherwise normal on direct and                         retroflexion views. Recommendation:        - Discharge patient to home.                        - Resume previous diet.                        - No aspirin, ibuprofen, naproxen, or other                         non-steroidal anti-inflammatory drugs for 5 days after                         polyp removal.                        - Continue present medications.                        -  Await pathology results.                        - Repeat colonoscopy within 6 months for retreatment.                        - Return to referring physician as previously                         scheduled. Procedure Code(s):     --- Professional ---                        [phone], Colonoscopy, flexible; with removal of                         tumor(s), polyp(s), or other lesion(s) by snare                         technique                        [id number], Colonoscopy, flexible; with directed submucosal                         injection(s), any substance                        [id number], 59, Colonoscopy, flexible; with biopsy, single                         or multiple Diagnosis Code(s):     --- Professional ---                        K63.5, Polyp of colon                        Z83.71, Family history of colonic polyps                        K64.0, First degree hemorrhoids CPT copyright 2019 [employer]. All rights reserved. The codes documented in this report are preliminary and upon coder review may  be revised to meet current compliance requirements. Attending Participation:      I personally performed the entire procedure. Volney American, DO Annamaria Helling DO, DO 08/14/2021 10:52:04 AM This report has been signed  electronically. Number of Addenda: 0 Note Initiated On: 08/14/2021 9:34 AM Scope Withdrawal Time: 0 hours 53 minutes 39 seconds  Total Procedure Duration: 0 hours 57 minutes 22 seconds  Estimated Blood Loss:  Estimated blood loss was minimal.      [facility]

## 2021-08-14 NOTE — Anesthesia Preprocedure Evaluation (Addendum)
Anesthesia Evaluation  Patient identified by MRN, date of birth, ID band Patient awake    Reviewed: Allergy & Precautions, NPO status , Patient's Chart, lab work & pertinent test results  History of Anesthesia Complications Negative for: history of anesthetic complications  Airway Mallampati: III   Neck ROM: Full    Dental  (+)    Pulmonary sleep apnea ,    Pulmonary exam normal breath sounds clear to auscultation       Cardiovascular hypertension, Normal cardiovascular exam Rhythm:Regular Rate:Normal  ECG 09/28/20: Sinus tachycardia (HR 110), otherwise normal  Echo 05/08/21:  NORMAL LEFT VENTRICULAR SYSTOLIC FUNCTION  NORMAL RIGHT VENTRICULAR SYSTOLIC FUNCTION  NO VALVULAR STENOSIS  MILD MR, TR, PR  EF 50%     Neuro/Psych negative neurological ROS     GI/Hepatic GERD  ,  Endo/Other  diabetes, Type 2Class 3 obesity  Renal/GU negative Renal ROS     Musculoskeletal   Abdominal   Peds  Hematology negative hematology ROS (+)   Anesthesia Other Findings Cardiology note 04/04/21:  Plan  -Regular Stress for shortness of breath and angina  -Echocardiogram for further evaluation of dyspnea and fatigue with congestive heart failure, cardiomyopathy and coronary artery disease  -No additional medication management for stage I essential hypertension without apparent significant cardiac complication and other major risk factors at this time. Diet, exercise and the DASH diet have been discussed today for further Lifestyle modification for treatment of essential hypertension. The patient will watch closely for further for need in treatment of elevation of systolic and/or diastolic blood pressure. -Continue aggressive medical management of diabetes following the ABCs for prevention of cardiovascular disease and complications. The goals set forth include a goal HbA1c of less than 7, moderate to high intensity statin use if patient  can tolerate, and a goal systolic blood pressure of below 151mm.  -Continue to use moderate to high intensive cholesterol therapy for further future risk reduction in cardiovascular disease and complication. The patient currently understands the goals, risks, and benefits of lipid treatment. We have discussed the potential side effects profile of these medications and or symptoms. They will watch for any new symptoms.    Reproductive/Obstetrics                            Anesthesia Physical Anesthesia Plan  ASA: 3  Anesthesia Plan: General   Post-op Pain Management:    Induction: Intravenous  PONV Risk Score and Plan: 2 and Propofol infusion, TIVA and Treatment may vary due to age or medical condition  Airway Management Planned: Natural Airway  Additional Equipment:   Intra-op Plan:   Post-operative Plan:   Informed Consent: I have reviewed the patients History and Physical, chart, labs and discussed the procedure including the risks, benefits and alternatives for the proposed anesthesia with the patient or authorized representative who has indicated his/her understanding and acceptance.       Plan Discussed with: CRNA  Anesthesia Plan Comments: (LMA/GETA backup discussed.  Patient consented for risks of anesthesia including but not limited to:  - adverse reactions to medications - damage to eyes, teeth, lips or other oral mucosa - nerve damage due to positioning  - sore throat or hoarseness - damage to heart, brain, nerves, lungs, other parts of body or loss of life  Informed patient about role of CRNA in peri- and intra-operative care.  Patient voiced understanding.)        Anesthesia Quick Evaluation

## 2021-08-14 NOTE — Transfer of Care (Signed)
Immediate Anesthesia Transfer of Care Note  Patient: Jack Barajas  Procedure(s) Performed: COLONOSCOPY  Patient Location: PACU  Anesthesia Type:General  Level of Consciousness: awake and drowsy  Airway & Oxygen Therapy: Patient Spontanous Breathing  Post-op Assessment: Report given to RN and Post -op Vital signs reviewed and stable  Post vital signs: Reviewed and stable  Last Vitals:  Vitals Value Taken Time  BP 110/82 08/14/21 1050  Temp 35.6 C 08/14/21 1048  Pulse 91 08/14/21 1050  Resp 17 08/14/21 1050  SpO2 96 % 08/14/21 1050    Last Pain:  Vitals:   08/14/21 0846  TempSrc: Temporal  PainSc: 0-No pain         Complications: No notable events documented.

## 2021-08-15 ENCOUNTER — Encounter: Payer: Self-pay | Admitting: Gastroenterology

## 2021-08-15 LAB — SURGICAL PATHOLOGY

## 2021-08-24 ENCOUNTER — Encounter
Admission: RE | Admit: 2021-08-24 | Discharge: 2021-08-24 | Disposition: A | Discharge status: Home / Self Care | Payer: 59 | Source: Ambulatory Visit | Attending: Unknown Physician Specialty | Admitting: Unknown Physician Specialty

## 2021-08-24 ENCOUNTER — Other Ambulatory Visit: Payer: Self-pay

## 2021-08-24 DIAGNOSIS — E119 Type 2 diabetes mellitus without complications: Secondary | ICD-10-CM

## 2021-08-24 DIAGNOSIS — J984 Other disorders of lung: Secondary | ICD-10-CM

## 2021-08-24 DIAGNOSIS — I2510 Atherosclerotic heart disease of native coronary artery without angina pectoris: Secondary | ICD-10-CM

## 2021-08-24 NOTE — Patient Instructions (Signed)
Your procedure is scheduled on:08-29-21 Tuesday Report to the Registration Desk on the 1st floor of the [facility].Then proceed to the 2nd floor Surgery Desk in the [facility] To find out your arrival time, please call [phone] between 1PM - 3PM on:08-28-21 Monday  REMEMBER: Instructions that are not followed completely may result in serious medical risk, up to and including death; or upon the discretion of your surgeon and anesthesiologist your surgery may need to be rescheduled.  Do not eat food after midnight the night before surgery.  No gum chewing, lozengers or hard candies.  You may however, drink Water up to 2 hours before you are scheduled to arrive for your surgery. Do not drink anything within 2 hours of your scheduled arrival time.  Type 1 and Type 2 diabetics should only drink water  Do not take any medication the day of surgery  Stop metFORMIN (GLUCOPHAGE-XR) 500 MG 24 hr tablet 2 days prior to surgery-Last dose on 08-26-21 Saturday  Take half of your insulin detemir (LEVEMIR) 100 UNIT/ML FlexPen (12 units) the night before your surgery and NO insulin the morning of surgery  One week prior to surgery: Stop Anti-inflammatories (NSAIDS) such as Advil, Aleve, Ibuprofen, Motrin, Naproxen, Naprosyn and Aspirin based products such as Excedrin, Goodys Powder, BC Powder.You may however, take Tylenol if needed for pain up until the day of surgery.  Stop ANY OVER THE COUNTER supplements/vitamins NOW (08-24-21) until after surgery (Cyanocobalamin (VITAMIN B 12 PO, Multiple Vitamin (MULTIVITAMIN WITH MINERALS) TABS tablet, and VITAMIN D PO, glucosamine-chondroitin 500-400 MG tablet)  No Alcohol for 24 hours before or after surgery.  No Smoking including e-cigarettes for 24 hours prior to surgery.  No chewable tobacco products for at least 6 hours prior to surgery.  No nicotine patches on the day of surgery.  Do not use any "recreational" drugs for at least a week prior to  your surgery.  Please be advised that the combination of cocaine and anesthesia may have negative outcomes, up to and including death. If you test positive for cocaine, your surgery will be cancelled.  On the morning of surgery brush your teeth with toothpaste and water, you may rinse your mouth with mouthwash if you wish. Do not swallow any toothpaste or mouthwash.  Do not wear jewelry, make-up, hairpins, clips or nail polish.  Do not wear lotions, powders, or perfumes.   Do not shave body from the neck down 48 hours prior to surgery just in case you cut yourself which could leave a site for infection.  Also, freshly shaved skin may become irritated if using the CHG soap.  Contact lenses, hearing aids and dentures may not be worn into surgery.  Do not bring valuables to the hospital. [facility] is not responsible for any missing/lost belongings or valuables.   Bring your C-PAP to the hospital with you in case you may have to spend the night.   Notify your doctor if there is any change in your medical condition (cold, fever, infection).  Wear comfortable clothing (specific to your surgery type) to the hospital.  After surgery, you can help prevent lung complications by doing breathing exercises.  Take deep breaths and cough every 1-2 hours. Your doctor may order a device called an Incentive Spirometer to help you take deep breaths. When coughing or sneezing, hold a pillow firmly against your incision with both hands. This is called "splinting." Doing this helps protect your incision. It also decreases belly discomfort.  If you  are being admitted to the hospital overnight, leave your suitcase in the car. After surgery it may be brought to your room.  If you are being discharged the day of surgery, you will not be allowed to drive home. You will need a responsible adult (18 years or older) to drive you home and stay with you that night.   If you are taking public transportation, you  will need to have a responsible adult (18 years or older) with you. Please confirm with your physician that it is acceptable to use public transportation.   Please call the [facility] at [phone] if you have any questions about these instructions.  Surgery Visitation Policy:  Patients undergoing a surgery or procedure may have one family member or support person with them as long as that person is not COVID-19 positive or experiencing its symptoms.  That person may remain in the waiting area during the procedure and may rotate out with other people.  Inpatient Visitation:    Visiting hours are 7 a.m. to 8 p.m. Up to two visitors ages 16+ are allowed at one time in a patient room. The visitors may rotate out with other people during the day. Visitors must check out when they leave, or other visitors will not be allowed. One designated support person may remain overnight. The visitor must pass COVID-19 screenings, use hand sanitizer when entering and exiting the patient's room and wear a mask at all times, including in the patient's room. Patients must also wear a mask when staff or their visitor are in the room. Masking is required regardless of vaccination status.

## 2021-08-28 ENCOUNTER — Encounter
Admission: RE | Admit: 2021-08-28 | Discharge: 2021-08-28 | Disposition: A | Discharge status: Home / Self Care | Payer: 59 | Source: Ambulatory Visit | Attending: Unknown Physician Specialty | Admitting: Unknown Physician Specialty

## 2021-08-28 ENCOUNTER — Other Ambulatory Visit: Payer: Self-pay

## 2021-08-28 DIAGNOSIS — Z794 Long term (current) use of insulin: Secondary | ICD-10-CM | POA: Insufficient documentation

## 2021-08-28 DIAGNOSIS — J984 Other disorders of lung: Secondary | ICD-10-CM

## 2021-08-28 DIAGNOSIS — Z01812 Encounter for preprocedural laboratory examination: Secondary | ICD-10-CM | POA: Insufficient documentation

## 2021-08-28 DIAGNOSIS — I2510 Atherosclerotic heart disease of native coronary artery without angina pectoris: Secondary | ICD-10-CM | POA: Diagnosis not present

## 2021-08-28 DIAGNOSIS — E119 Type 2 diabetes mellitus without complications: Secondary | ICD-10-CM | POA: Diagnosis not present

## 2021-08-28 LAB — BASIC METABOLIC PANEL
Anion gap: 6 (ref 5–15)
BUN: 12 mg/dL (ref 6–20)
CO2: 29 mmol/L (ref 22–32)
Calcium: 9.7 mg/dL (ref 8.9–10.3)
Chloride: 104 mmol/L (ref 98–111)
Creatinine, Ser: 0.85 mg/dL (ref 0.61–1.24)
GFR, Estimated: >60 mL/min (ref >60)
Glucose, Bld: 118 mg/dL — ABNORMAL HIGH (ref 70–99)
Potassium: 4.3 mmol/L (ref 3.5–5.1)
Sodium: 139 mmol/L (ref 135–145)

## 2021-08-28 LAB — CBC
HCT: 45.6 % (ref 39.0–52.0)
Hemoglobin: 14 g/dL (ref 13.0–17.0)
MCH: 28.1 pg (ref 26.0–34.0)
MCHC: 30.7 g/dL (ref 30.0–36.0)
MCV: 91.4 fL (ref 80.0–100.0)
Platelets: 329 10*3/uL (ref 150–400)
RBC: 4.99 MIL/uL (ref 4.22–5.81)
RDW: 13.3 % (ref 11.5–15.5)
WBC: 9.4 10*3/uL (ref 4.0–10.5)
nRBC: 0 % (ref 0.0–0.2)

## 2021-08-29 ENCOUNTER — Ambulatory Visit
Admission: RE | Admit: 2021-08-29 | Discharge: 2021-08-29 | Disposition: A | Discharge status: Home / Self Care | Payer: 59 | Source: Ambulatory Visit | Attending: Unknown Physician Specialty | Admitting: Unknown Physician Specialty

## 2021-08-29 ENCOUNTER — Ambulatory Visit: Payer: 59 | Admitting: Urgent Care

## 2021-08-29 ENCOUNTER — Encounter
Admission: RE | Disposition: A | Discharge status: Home / Self Care | Payer: Self-pay | Source: Ambulatory Visit | Attending: Unknown Physician Specialty

## 2021-08-29 ENCOUNTER — Encounter: Payer: Self-pay | Admitting: Unknown Physician Specialty

## 2021-08-29 ENCOUNTER — Ambulatory Visit: Payer: 59 | Admitting: Anesthesiology

## 2021-08-29 DIAGNOSIS — J342 Deviated nasal septum: Secondary | ICD-10-CM | POA: Insufficient documentation

## 2021-08-29 DIAGNOSIS — Z6841 Body Mass Index (BMI) 40.0 and over, adult: Secondary | ICD-10-CM | POA: Insufficient documentation

## 2021-08-29 DIAGNOSIS — J343 Hypertrophy of nasal turbinates: Secondary | ICD-10-CM | POA: Insufficient documentation

## 2021-08-29 DIAGNOSIS — J3489 Other specified disorders of nose and nasal sinuses: Secondary | ICD-10-CM | POA: Insufficient documentation

## 2021-08-29 DIAGNOSIS — E119 Type 2 diabetes mellitus without complications: Secondary | ICD-10-CM | POA: Diagnosis not present

## 2021-08-29 HISTORY — PX: NASAL SEPTOPLASTY W/ TURBINOPLASTY: SHX2070

## 2021-08-29 LAB — GLUCOSE, CAPILLARY
Glucose-Capillary: 148 mg/dL — ABNORMAL HIGH (ref 70–99)
Glucose-Capillary: 144 mg/dL — ABNORMAL HIGH (ref 70–99)

## 2021-08-29 SURGERY — SEPTOPLASTY, NOSE, WITH NASAL TURBINATE REDUCTION
Anesthesia: General | Site: Nose | Laterality: Bilateral

## 2021-08-29 MED ORDER — ONDANSETRON HCL 4 MG/2ML IJ SOLN
INTRAMUSCULAR | Status: AC
  Filled 2021-08-29: qty 2

## 2021-08-29 MED ORDER — DEXMEDETOMIDINE HCL IN NACL 200 MCG/50ML IV SOLN
INTRAVENOUS | Status: DC | PRN
  Administered 2021-08-29: 8 ug via INTRAVENOUS
  Administered 2021-08-29: 12 ug via INTRAVENOUS
  Administered 2021-08-29: 8 ug via INTRAVENOUS

## 2021-08-29 MED ORDER — PROPOFOL 10 MG/ML IV BOLUS
INTRAVENOUS | Status: DC | PRN
  Administered 2021-08-29: 100 mg via INTRAVENOUS
  Administered 2021-08-29 (×2): 50 mg via INTRAVENOUS

## 2021-08-29 MED ORDER — ONDANSETRON HCL 4 MG/2ML IJ SOLN
INTRAMUSCULAR | Status: DC | PRN
  Administered 2021-08-29: 4 mg via INTRAVENOUS

## 2021-08-29 MED ORDER — LIDOCAINE HCL (PF) 4 % IJ SOLN
INTRAMUSCULAR | Status: DC | PRN
  Administered 2021-08-29: 10 mL

## 2021-08-29 MED ORDER — OXYMETAZOLINE HCL 0.05 % NA SOLN
NASAL | Status: AC
  Administered 2021-08-29: 6 via NASAL
  Filled 2021-08-29: qty 30

## 2021-08-29 MED ORDER — DEXAMETHASONE SODIUM PHOSPHATE 10 MG/ML IJ SOLN
INTRAMUSCULAR | Status: DC | PRN
  Administered 2021-08-29: 10 mg via INTRAVENOUS

## 2021-08-29 MED ORDER — FENTANYL CITRATE (PF) 100 MCG/2ML IJ SOLN: 25.0000 ug | INTRAMUSCULAR | Status: DC | PRN

## 2021-08-29 MED ORDER — FENTANYL CITRATE (PF) 100 MCG/2ML IJ SOLN
INTRAMUSCULAR | Status: DC | PRN
  Administered 2021-08-29: 100 ug via INTRAVENOUS

## 2021-08-29 MED ORDER — SUCCINYLCHOLINE CHLORIDE 200 MG/10ML IV SOSY
PREFILLED_SYRINGE | INTRAVENOUS | Status: AC
  Filled 2021-08-29: qty 10

## 2021-08-29 MED ORDER — SODIUM CHLORIDE 0.9 % IV SOLN: INTRAVENOUS | Status: DC

## 2021-08-29 MED ORDER — PHENYLEPHRINE HCL (PRESSORS) 10 MG/ML IV SOLN
INTRAVENOUS | Status: DC | PRN
  Administered 2021-08-29: 50 ug via INTRAVENOUS
  Administered 2021-08-29: 100 ug via INTRAVENOUS

## 2021-08-29 MED ORDER — PROPOFOL 10 MG/ML IV BOLUS
INTRAVENOUS | Status: AC
  Filled 2021-08-29: qty 20

## 2021-08-29 MED ORDER — SUGAMMADEX SODIUM 200 MG/2ML IV SOLN
INTRAVENOUS | Status: DC | PRN
  Administered 2021-08-29: 200 mg via INTRAVENOUS

## 2021-08-29 MED ORDER — FAMOTIDINE 20 MG PO TABS: 20.0000 mg | ORAL_TABLET | Freq: Once | ORAL | Status: AC

## 2021-08-29 MED ORDER — SEVOFLURANE IN SOLN
RESPIRATORY_TRACT | Status: AC
  Filled 2021-08-29: qty 250

## 2021-08-29 MED ORDER — GLYCOPYRROLATE 0.2 MG/ML IJ SOLN
INTRAMUSCULAR | Status: AC
  Filled 2021-08-29: qty 1

## 2021-08-29 MED ORDER — ROCURONIUM BROMIDE 100 MG/10ML IV SOLN
INTRAVENOUS | Status: DC | PRN
  Administered 2021-08-29 (×2): 10 mg via INTRAVENOUS

## 2021-08-29 MED ORDER — SULFAMETHOXAZOLE-TRIMETHOPRIM 800-160 MG PO TABS: 1.0000 | ORAL_TABLET | Freq: Two times a day (BID) | ORAL | 0 refills | Status: DC

## 2021-08-29 MED ORDER — PROMETHAZINE HCL 25 MG/ML IJ SOLN: 6.2500 mg | INTRAMUSCULAR | Status: DC | PRN

## 2021-08-29 MED ORDER — LIDOCAINE-EPINEPHRINE 1 %-1:100000 IJ SOLN
INTRAMUSCULAR | Status: DC | PRN
  Administered 2021-08-29: 12 mL

## 2021-08-29 MED ORDER — ROCURONIUM BROMIDE 10 MG/ML (PF) SYRINGE
PREFILLED_SYRINGE | INTRAVENOUS | Status: AC
  Filled 2021-08-29: qty 10

## 2021-08-29 MED ORDER — HYDROCODONE-ACETAMINOPHEN 5-300 MG PO TABS
1.0000 | ORAL_TABLET | ORAL | 0 refills | Status: DC | PRN
Start: 2021-08-29 — End: 2022-01-02

## 2021-08-29 MED ORDER — DEXAMETHASONE SODIUM PHOSPHATE 10 MG/ML IJ SOLN
INTRAMUSCULAR | Status: AC
  Filled 2021-08-29: qty 1

## 2021-08-29 MED ORDER — FENTANYL CITRATE (PF) 100 MCG/2ML IJ SOLN
INTRAMUSCULAR | Status: AC
  Filled 2021-08-29: qty 2

## 2021-08-29 MED ORDER — ACETAMINOPHEN 10 MG/ML IV SOLN
INTRAVENOUS | Status: DC | PRN
  Administered 2021-08-29: 1000 mg via INTRAVENOUS

## 2021-08-29 MED ORDER — OXYMETAZOLINE HCL 0.05 % NA SOLN: 6.0000 | Freq: Once | NASAL | Status: AC

## 2021-08-29 MED ORDER — FAMOTIDINE 20 MG PO TABS
ORAL_TABLET | ORAL | Status: AC
  Administered 2021-08-29: 20 mg via ORAL
  Filled 2021-08-29: qty 1

## 2021-08-29 MED ORDER — 0.9 % SODIUM CHLORIDE (POUR BTL) OPTIME
TOPICAL | Status: DC | PRN
  Administered 2021-08-29: 10 mL

## 2021-08-29 MED ORDER — CHLORHEXIDINE GLUCONATE 0.12 % MT SOLN
OROMUCOSAL | Status: AC
  Administered 2021-08-29: 15 mL via OROMUCOSAL
  Filled 2021-08-29: qty 15

## 2021-08-29 MED ORDER — CHLORHEXIDINE GLUCONATE 0.12 % MT SOLN: 15.0000 mL | Freq: Once | OROMUCOSAL | Status: AC

## 2021-08-29 MED ORDER — OXYMETAZOLINE HCL 0.05 % NA SOLN
NASAL | Status: DC | PRN
  Administered 2021-08-29: 1 via TOPICAL

## 2021-08-29 MED ORDER — GLYCOPYRROLATE 0.2 MG/ML IJ SOLN
INTRAMUSCULAR | Status: DC | PRN
  Administered 2021-08-29: .2 mg via INTRAVENOUS

## 2021-08-29 MED ORDER — BACITRACIN ZINC 500 UNIT/GM EX OINT
TOPICAL_OINTMENT | CUTANEOUS | Status: DC | PRN
  Administered 2021-08-29: 1 via TOPICAL

## 2021-08-29 MED ORDER — ORAL CARE MOUTH RINSE: 15.0000 mL | Freq: Once | OROMUCOSAL | Status: AC

## 2021-08-29 MED ORDER — ACETAMINOPHEN 10 MG/ML IV SOLN
INTRAVENOUS | Status: AC
  Filled 2021-08-29: qty 100

## 2021-08-29 MED ORDER — SUCCINYLCHOLINE CHLORIDE 200 MG/10ML IV SOSY
PREFILLED_SYRINGE | INTRAVENOUS | Status: DC | PRN
  Administered 2021-08-29: 120 mg via INTRAVENOUS

## 2021-08-29 SURGICAL SUPPLY — 24 items
BLADE SURG 15 STRL LF DISP TIS (BLADE) ×1 IMPLANT
BLADE SURG 15 STRL SS (BLADE) ×2
COAG SUCT 10F 3.5MM HAND CTRL (MISCELLANEOUS) ×2 IMPLANT
DRESSING NASL FOAM PST OP SINU (MISCELLANEOUS) ×2 IMPLANT
DRSG NASAL FOAM POST OP SINU (MISCELLANEOUS) ×2
ELECT REM PT RETURN 9FT ADLT (ELECTROSURGICAL) ×2
ELECTRODE REM PT RTRN 9FT ADLT (ELECTROSURGICAL) ×1 IMPLANT
GAUZE 4X4 16PLY ~~LOC~~+RFID DBL (SPONGE) ×2 IMPLANT
GLOVE SURG ENC MOIS LTX SZ7.5 (GLOVE) ×4 IMPLANT
GOWN STRL REUS W/ TWL LRG LVL3 (GOWN DISPOSABLE) ×2 IMPLANT
GOWN STRL REUS W/TWL LRG LVL3 (GOWN DISPOSABLE) ×4
LABEL OR SOLS (LABEL) ×2 IMPLANT
MANIFOLD NEPTUNE II (INSTRUMENTS) ×2 IMPLANT
NS IRRIG 500ML POUR BTL (IV SOLUTION) ×2 IMPLANT
PACK HEAD/NECK (MISCELLANEOUS) ×2 IMPLANT
SPLINT NASAL REUTER .5MM (MISCELLANEOUS) ×1 IMPLANT
SPONGE NEURO XRAY DETECT 1X3 (DISPOSABLE) ×2 IMPLANT
SUT CHROMIC 3-0 (SUTURE) ×2
SUT CHROMIC 3-0 KS 27XMFL CR (SUTURE) ×1
SUT ETHILON 3-0 KS 30 BLK (SUTURE) ×2 IMPLANT
SUT PLAIN GUT 4-0 (SUTURE) ×1 IMPLANT
SUTURE CHRMC 3-0 KS 27XMFL CR (SUTURE) ×1 IMPLANT
WATER STERILE IRR 1000ML POUR (IV SOLUTION) ×2 IMPLANT
WATER STERILE IRR 500ML POUR (IV SOLUTION) ×2 IMPLANT

## 2021-08-29 NOTE — Anesthesia Preprocedure Evaluation (Signed)
Anesthesia Evaluation  Patient identified by MRN, date of birth, ID band Patient awake    Reviewed: Allergy & Precautions, NPO status , Patient's Chart, lab work & pertinent test results  History of Anesthesia Complications Negative for: history of anesthetic complications  Airway Mallampati: III   Neck ROM: Full    Dental  (+) Dental Advidsory Given, Chipped,    Pulmonary neg shortness of breath, sleep apnea , neg COPD, neg recent URI,    Pulmonary exam normal breath sounds clear to auscultation       Cardiovascular Exercise Tolerance: Good hypertension, (-) angina(-) Past MI and (-) Cardiac Stents Normal cardiovascular exam(-) dysrhythmias (-) Valvular Problems/Murmurs Rhythm:Regular Rate:Normal  ECG 09/28/20: Sinus tachycardia (HR 110), otherwise normal  Echo 05/08/21:  NORMAL LEFT VENTRICULAR SYSTOLIC FUNCTION  NORMAL RIGHT VENTRICULAR SYSTOLIC FUNCTION  NO VALVULAR STENOSIS  MILD MR, TR, PR  EF 50%     Neuro/Psych negative neurological ROS     GI/Hepatic Neg liver ROS, GERD  ,  Endo/Other  diabetes, Type 2Morbid obesityClass 3 obesity  Renal/GU negative Renal ROS     Musculoskeletal   Abdominal   Peds  Hematology negative hematology ROS (+)   Anesthesia Other Findings Cardiology note 04/04/21:  Plan  -Regular Stress for shortness of breath and angina  -Echocardiogram for further evaluation of dyspnea and fatigue with congestive heart failure, cardiomyopathy and coronary artery disease  -No additional medication management for stage I essential hypertension without apparent significant cardiac complication and other major risk factors at this time. Diet, exercise and the DASH diet have been discussed today for further Lifestyle modification for treatment of essential hypertension. The patient will watch closely for further for need in treatment of elevation of systolic and/or diastolic blood  pressure. -Continue aggressive medical management of diabetes following the ABCs for prevention of cardiovascular disease and complications. The goals set forth include a goal HbA1c of less than 7, moderate to high intensity statin use if patient can tolerate, and a goal systolic blood pressure of below 142mm.  -Continue to use moderate to high intensive cholesterol therapy for further future risk reduction in cardiovascular disease and complication. The patient currently understands the goals, risks, and benefits of lipid treatment. We have discussed the potential side effects profile of these medications and or symptoms. They will watch for any new symptoms.    Reproductive/Obstetrics                             Anesthesia Physical  Anesthesia Plan  ASA: 3  Anesthesia Plan: General   Post-op Pain Management:    Induction: Intravenous  PONV Risk Score and Plan: 2 and Treatment may vary due to age or medical condition, Ondansetron, Dexamethasone and Midazolam  Airway Management Planned: Oral ETT  Additional Equipment:   Intra-op Plan:   Post-operative Plan: Extubation in OR  Informed Consent: I have reviewed the patients History and Physical, chart, labs and discussed the procedure including the risks, benefits and alternatives for the proposed anesthesia with the patient or authorized representative who has indicated his/her understanding and acceptance.       Plan Discussed with: CRNA  Anesthesia Plan Comments: (LMA/GETA backup discussed.  Patient consented for risks of anesthesia including but not limited to:  - adverse reactions to medications - damage to eyes, teeth, lips or other oral mucosa - nerve damage due to positioning  - sore throat or hoarseness - damage to heart, brain, nerves,  lungs, other parts of body or loss of life  Informed patient about role of CRNA in peri- and intra-operative care.  Patient voiced understanding.)         Anesthesia Quick Evaluation

## 2021-08-29 NOTE — H&P (Signed)
The patient's history has been reviewed, patient examined, no change in status, stable for surgery.  Questions were answered to the patients satisfaction.  

## 2021-08-29 NOTE — Transfer of Care (Signed)
Immediate Anesthesia Transfer of Care Note  Patient: Jack Barajas  Procedure(s) Performed: NASAL SEPTOPLASTY WITH TURBINATE REDUCTION (Bilateral: Nose)  Patient Location: PACU  Anesthesia Type:General  Level of Consciousness: drowsy and patient cooperative  Airway & Oxygen Therapy: Patient Spontanous Breathing  Post-op Assessment: Report given to RN and Post -op Vital signs reviewed and stable  Post vital signs: Reviewed and stable  Last Vitals:  Vitals Value Taken Time  BP 150/100 08/29/21 1018  Temp    Pulse 95 08/29/21 1018  Resp 15 08/29/21 1018  SpO2 96 % 08/29/21 1018  Vitals shown include unvalidated device data.  Last Pain:  Vitals:   08/29/21 0820  TempSrc: Temporal  PainSc: 0-No pain      Patients Stated Pain Goal: 0 (69/43/70 0525)  Complications: No notable events documented.

## 2021-08-29 NOTE — Anesthesia Procedure Notes (Signed)
Procedure Name: Intubation Date/Time: 08/29/2021 9:15 AM Performed by: Jonna Clark, CRNA Pre-anesthesia Checklist: Patient identified, Patient being monitored, Timeout performed, Emergency Drugs available and Suction available Patient Re-evaluated:Patient Re-evaluated prior to induction Oxygen Delivery Method: Circle system utilized Preoxygenation: Pre-oxygenation with 100% oxygen Induction Type: IV induction Ventilation: Mask ventilation without difficulty Laryngoscope Size: Mac and 4 Grade View: Grade I Tube type: Oral Rae Tube size: 7.5 mm Number of attempts: 1 Airway Equipment and Method: Stylet Placement Confirmation: ETT inserted through vocal cords under direct vision, positive ETCO2 and breath sounds checked- equal and bilateral Secured at: 21 cm Tube secured with: Tape Dental Injury: Teeth and Oropharynx as per pre-operative assessment

## 2021-08-29 NOTE — Op Note (Signed)
PREOPERATIVE DIAGNOSIS:  Chronic nasal obstruction.  POSTOPERATIVE DIAGNOSIS:  Chronic nasal obstruction.  SURGEON:  Roena Malady, M.D.  NAME OF PROCEDURE:  Nasal septoplasty. Submucous resection of inferior turbinates.  OPERATIVE FINDINGS:  Severe nasal septal deformity, hypertrophy of the inferior turbinates.   DESCRIPTION OF THE PROCEDURE:  Jack Barajas was identified in the holding area and taken to the operating room and placed in the supine position.  After general endotracheal anesthesia was induced, the table was turned 45 degrees and the patient was placed in a semi-Fowler position.  The nose was then topically anesthetized with Lidocaine, cotton pledgets were placed within each nostril. After approximately 5 minutes, this was removed at which time a local anesthetic of 1% Lidocaine 1:100,000 units of Epinephrine was used to inject the inferior turbinates in the nasal septum. A total of 13 ml was used. Examination of the nose showed a severe right nasal septal deformity and hypertrophied inferior turbinate.  Beginning on the right hand side a hemitransfixion incision was then created on the leading edge of the septum on the right.  A subperichondrial plane was elevated posteriorly on the left and taken back to the perpendicular plate of the ethmoid where subperiosteal plane was elevated posteriorly on the left. A septal spur was identified on the right hand side impacting on the inferior turbinate.  An inferior rim of cartilage was removed anteriorly with care taken to leave an anterior strut to prevent nasal collapse. With this strut removed the perpendicular plate of the ethmoid was separated from the quadrangular cartilage. The large septal spur was removed.  The septum was then replaced in the midline. Reinspection through each nostril showed excellent reduction of the septal deformity. A left posterior inferior fenestration was then created to allow hematoma drainage.  With the  septoplasty completed, beginning on the left-hand side, a 15 blade was used to incise along the inferior edge of the inferior turbinate. A superior laterally based flap was then elevated. The underlying conchal bone of mucosa was excised using Knight scissors. The flap was then laid back over the turbinate stump and cauterized using suction cautery. In a similar fashion the submucous resection was performed on the right.  With the submucous resection completed bilaterally and no active bleeding, the hemitransfixion incision was then closed using two interrupted 3-0 chromic sutures.  Plastic nasal septal splints were placed within each nostril and affixed to the septum using a 3-0 nylon suture. Stammberger was then used beneath each inferior turbinate for hemostasis.    The patient tolerated the procedure well, was returned to anesthesia, extubated in the operating room, and taken to the recovery room in stable condition.    CULTURES:  None.  SPECIMENS:  None.  ESTIMATED BLOOD LOSS:  25 cc.  Roena Malady  08/29/2021  10:07 AM

## 2021-08-29 NOTE — Discharge Instructions (Signed)

## 2021-08-29 NOTE — Progress Notes (Signed)
Per Dr. Rosey Bath, patient is okay to discharge home. No BP interventions ordered as patient as at his pre surgical baseline

## 2021-08-30 NOTE — Anesthesia Postprocedure Evaluation (Signed)
Anesthesia Post Note  Patient: Jack Barajas  Procedure(s) Performed: NASAL SEPTOPLASTY WITH TURBINATE REDUCTION (Bilateral: Nose)  Patient location during evaluation: PACU Anesthesia Type: General Level of consciousness: awake and alert Pain management: pain level controlled Vital Signs Assessment: post-procedure vital signs reviewed and stable Respiratory status: spontaneous breathing, nonlabored ventilation, respiratory function stable and patient connected to nasal cannula oxygen Cardiovascular status: blood pressure returned to baseline and stable Postop Assessment: no apparent nausea or vomiting Anesthetic complications: no   No notable events documented.   Last Vitals:  Vitals:   08/29/21 1100 08/29/21 1125  BP: (!) 152/99 (!) 162/108  Pulse: 88 77  Resp: 14 17  Temp: 36.6 C (!) 36.1 C  SpO2: 96% 95%    Last Pain:  Vitals:   08/30/21 0946  TempSrc:   PainSc: 3                  Martha Clan

## 2021-09-22 NOTE — Progress Notes (Signed)
[facility] [place], [place] [address]  Pulmonary Sleep Medicine   Office Visit Note  Patient Name: Jack Barajas DOB: [birth year] MRN [id number]    Chief Complaint: Obstructive Sleep Apnea visit  Brief History:  Jack Barajas is seen today for initial consult to establish care, post setup The patient has a 3 month history of sleep apnea. Initial symptoms included snoring, excessive daytime sleepiness & drowsy driving. Patient is not using PAP nightly since having ENT surgery & said at his follow up with his surgeon today, he was told he could get back on his therapy.  The patient  when using PAP , feels  much better after sleeping with PAP.  The patient reports benefiting  from PAP use. Epworth Sleepiness Score is 5 out of 24. The patient does not take naps. The patient complains of the following: wanting to change to nasal mask  now that he has had ENT surgery  The compliance download shows  compliance with an average use time of 3:11 hours @ 50%. The AHI is 0.8  The patient does not complain of limb movements disrupting sleep.  ROS  General: (-) fever, (-) chills, (-) night sweat Nose and Sinuses: (-) nasal stuffiness or itchiness, (-) postnasal drip, (-) nosebleeds, (-) sinus trouble. Mouth and Throat: (-) sore throat, (-) hoarseness. Neck: (-) swollen glands, (-) enlarged thyroid, (-) neck pain. Respiratory: - cough, - shortness of breath, - wheezing. Neurologic: - numbness, - tingling. Psychiatric: - anxiety, - depression   Current Medication: Outpatient Encounter Medications as of 09/25/2021  Medication Sig   cetirizine (ZYRTEC) 10 MG tablet Take by mouth.   cyanocobalamin 1000 MCG tablet Take 2 tablets daily for 2 weeks, then reduce to 1 tablet daily thereafter for Vitamin B12 Deficiency.   EPINEPHrine 0.3 mg/0.3 mL IJ SOAJ injection Inject into the muscle.   fluticasone (FLONASE) 50 MCG/ACT nasal spray Place into the nose.   insulin lispro (HUMALOG  KWIKPEN) 100 UNIT/ML KwikPen Inject into the skin.   metFORMIN (GLUCOPHAGE-XR) 500 MG 24 hr tablet TAKE 1 TABLET(500 MG) BY MOUTH DAILY WITH DINNER   cetirizine (ZYRTEC) 10 MG tablet Take 10 mg by mouth daily.   Cyanocobalamin (VITAMIN B 12 PO) Take 1 tablet by mouth 2 (two) times daily.   fluticasone (FLONASE) 50 MCG/ACT nasal spray Place 1 spray into both nostrils daily.   Fluticasone-Umeclidin-Vilant (TRELEGY ELLIPTA) 100-62.5-25 MCG/ACT AEPB Inhale 1 puff into the lungs at bedtime.   glucosamine-chondroitin 500-400 MG tablet Take 1 tablet by mouth daily.   HYDROcodone-Acetaminophen 5-300 MG TABS Take 1-2 tablets by mouth every 4 (four) hours as needed.   insulin aspart (NOVOLOG) 100 UNIT/ML FlexPen Inject 14 Units into the skin 3 (three) times daily with meals. (Patient not taking: Reported on 08/24/2021)   insulin detemir (LEVEMIR) 100 UNIT/ML FlexPen Inject 23 Units into the skin daily. (Patient taking differently: Inject 24 Units into the skin at bedtime.)   Insulin Pen Needle (PEN NEEDLES) 30G X 8 MM MISC 1 each by Does not apply route 4 (four) times daily -  with meals and at bedtime.   Ipratropium-Albuterol (COMBIVENT) 20-100 MCG/ACT AERS respimat Inhale 1 puff into the lungs every 6 (six) hours. (Patient not taking: Reported on 08/24/2021)   LANTUS SOLOSTAR 100 UNIT/ML Solostar Pen Inject into the skin.   lovastatin (MEVACOR) 10 MG tablet Take 10 mg by mouth at bedtime.   metFORMIN (GLUCOPHAGE-XR) 500 MG 24 hr tablet Take 500 mg by mouth at  bedtime.   Multiple Vitamin (MULTIVITAMIN WITH MINERALS) TABS tablet Take 1 tablet by mouth daily.   VITAMIN D PO Take 1 tablet by mouth 2 (two) times daily.   [DISCONTINUED] albuterol (PROVENTIL HFA;VENTOLIN HFA) 108 (90 Base) MCG/ACT inhaler Inhale 2 puffs into the lungs every 4 (four) hours as needed.   [DISCONTINUED] sulfamethoxazole-trimethoprim (BACTRIM DS) 800-160 MG tablet Take 1 tablet by mouth 2 (two) times daily.   No facility-administered  encounter medications on file as of 09/25/2021.    Surgical History: Past Surgical History:  Procedure Laterality Date   COLONOSCOPY N/A 08/14/2021   Procedure: COLONOSCOPY;  Surgeon: Annamaria Helling, DO;  Location: [facility] ENDOSCOPY;  Service: Gastroenterology;  Laterality: N/A;   HAND SURGERY Right    fracture   NASAL SEPTOPLASTY W/ TURBINOPLASTY Bilateral 08/29/2021   Procedure: NASAL SEPTOPLASTY WITH TURBINATE REDUCTION;  Surgeon: Beverly Gust, MD;  Location: [facility] ORS;  Service: ENT;  Laterality: Bilateral;   TOE SURGERY     TONSILLECTOMY     WRIST SURGERY      Medical History: Past Medical History:  Diagnosis Date   COVID-19 09/2020   hospitalized-followed by [facility] Gins   Diabetes mellitus without complication (HCC)    Dyspnea    GERD (gastroesophageal reflux disease)    Hypertension    Sleep apnea    uses cpap    Family History: Non contributory to the present illness  Social History: Social History   Socioeconomic History   Marital status: Married    Spouse name: Not on file   Number of children: Not on file   Years of education: Not on file   Highest education level: Not on file  Occupational History   Not on file  Tobacco Use   Smoking status: Never   Smokeless tobacco: Never  Vaping Use   Vaping Use: Never used  Substance and Sexual Activity   Alcohol use: Yes    Comment: occassionally   Drug use: No   Sexual activity: Not on file  Other Topics Concern   Not on file  Social History Narrative   Not on file   Social Determinants of Health   Financial Resource Strain: Not on file  Food Insecurity: Not on file  Transportation Needs: Not on file  Physical Activity: Not on file  Stress: Not on file  Social Connections: Not on file  Intimate Partner Violence: Not on file    Vital Signs: Blood pressure (!) 156/94, pulse 91, resp. rate 16, height 6' (1.829 m), weight 300 lb (136.1 kg), SpO2 94 %. Body mass index is 40.69 kg/m.     Examination: General Appearance: The patient is well-developed, well-nourished, and in no distress. Neck Circumference: 51 cm Skin: Gross inspection of skin unremarkable. Head: normocephalic, no gross deformities. Eyes: no gross deformities noted. ENT: ears appear grossly normal Neurologic: Alert and oriented. No involuntary movements.    EPWORTH SLEEPINESS SCALE:  Scale:  (0)= no chance of dozing; (1)= slight chance of dozing; (2)= moderate chance of dozing; (3)= high chance of dozing  Chance  Situtation    Sitting and reading: 1    Watching TV: 1    Sitting Inactive in public: 0    As a passenger in car: 1      Lying down to rest: 1    Sitting and talking: 0    Sitting quielty after lunch: 1    In a car, stopped in traffic: 0   TOTAL SCORE:   5 out of 24  SLEEP STUDIES:  HST 04/08/21 -   REI 91.2,  low SpO2 @ 80%   CPAP COMPLIANCE DATA:  Date Range: 07/29/21 - 08/27/21  Average Daily Use: 3:11 hours  Median Use: 6:21 hours  Compliance for > 4 Hours: 50% days  AHI: 0.8 respiratory events per hour  Days Used: 15/30  Mask Leak: 37.9 lpm  95th Percentile Pressure: 14 cmH2O         LABS: Recent Results (from the past 2160 hour(s))  Glucose, capillary     Status: Abnormal   Collection Time: 08/14/21  9:01 AM  Result Value Ref Range   Glucose-Capillary 149 (H) 70 - 99 mg/dL    Comment: Glucose reference range applies only to samples taken after fasting for at least 8 hours.  Surgical pathology     Status: None   Collection Time: 08/14/21  9:48 AM  Result Value Ref Range   SURGICAL PATHOLOGY      SURGICAL PATHOLOGY CASE: [id number] PATIENT: Jack Barajas Surgical Pathology Report     Specimen Submitted: A. Colon polyp, ascending; cold snare B. Colon polyp x3, transverse; cbx(1) c(1) h(1) snare C. Colon polyp x2, descending; cold snare  Clinical History: Family history of polyps in the colon Z83.71.  Colon polyps,  hemorrhoids    DIAGNOSIS: A. COLON POLYP, ASCENDING; COLD SNARE: - TUBULAR ADENOMA. - NEGATIVE FOR HIGH-GRADE DYSPLASIA AND MALIGNANCY.  B. COLON POLYPS X3, TRANSVERSE; COLD BIOPSY, COLD SNARE, AND HOT SNARE: - MULTIPLE FRAGMENTS OF TUBULAR ADENOMAS. - NEGATIVE FOR HIGH-GRADE DYSPLASIA AND MALIGNANCY.  C. COLON POLYPS X2, DESCENDING; COLD SNARE: - MULTIPLE FRAGMENTS OF TUBULAR ADENOMAS. - NEGATIVE FOR HIGH-GRADE DYSPLASIA AND MALIGNANCY.  GROSS DESCRIPTION: A. Labeled: Ascending colon polyp cold snare Received: Formalin Collection time: 9:48 AM on 08/14/2021 Placed into formalin time: 9:48 AM on 08/14/2021 Tissue fragment(s): 2 Si ze: Ranges from 0.5-0.6 cm Description: Tan soft tissue fragments Entirely submitted in 1 cassette.  B. Labeled: Transverse colon polyp cbx x1, hot snare x1, polyp cold snare x1 Received: Formalin Collection time: 9:54 AM on 08/14/2021 Placed into formalin time: 9:54 AM on 08/14/2021 Tissue fragment(s): Multiple Size: Aggregate, 2.0 x 1.5 x 0.5 cm Description: Tan-pink soft tissue fragments Entirely submitted in 2 cassettes.  C. Labeled: Descending colon polyp cold snare x2 Received: Formalin Collection time: 10:17 AM on 08/14/2021 Placed into formalin time: 10:17 AM on 08/14/2021 Tissue fragment(s): Multiple Size: Aggregate, 1.2 x 0.5 x 0.2 cm Description: Tan soft tissue fragments Entirely submitted in 1 cassette.  [facility] 08/14/2021  Final Diagnosis performed by Allena Napoleon, MD.   Electronically signed 08/15/2021 9:34:33AM The electronic signature indicates that the named Attending Pathologist has evaluated the specimen Technical component performed at [facility], [address], [place], [place] [address] Lab: [phone] Dir: Rush Farmer, MD, MMM  Professional component performed at [facility], [facility], [place], [place], [place] [address] Lab: [phone] Dir: Kathi Simpers, MD   CBC     Status: None    Collection Time: 08/28/21  9:33 AM  Result Value Ref Range   WBC 9.4 4.0 - 10.5 K/uL   RBC 4.99 4.22 - 5.81 MIL/uL   Hemoglobin 14.0 13.0 - 17.0 g/dL   HCT 45.6 39.0 - 52.0 %   MCV 91.4 80.0 - 100.0 fL   MCH 28.1 26.0 - 34.0 pg   MCHC 30.7 30.0 - 36.0 g/dL   RDW 13.3 11.5 - 15.5 %   Platelets 329 150 - 400 K/uL   nRBC 0.0  0.0 - 0.2 %    Comment: Performed at [facility], [address] [place], [place] [address]  Basic metabolic panel     Status: Abnormal   Collection Time: 08/28/21  9:33 AM  Result Value Ref Range   Sodium 139 135 - 145 mmol/L   Potassium 4.3 3.5 - 5.1 mmol/L   Chloride 104 98 - 111 mmol/L   CO2 29 22 - 32 mmol/L   Glucose, Bld 118 (H) 70 - 99 mg/dL    Comment: Glucose reference range applies only to samples taken after fasting for at least 8 hours.   BUN 12 6 - 20 mg/dL   Creatinine, Ser 0.85 0.61 - 1.24 mg/dL   Calcium 9.7 8.9 - 10.3 mg/dL   GFR, Estimated >60 >60 mL/min    Comment: (NOTE) Calculated using the CKD-EPI Creatinine Equation (2021)    Anion gap 6 5 - 15    Comment: Performed at [facility], [address] [place], [place] [address]  Glucose, capillary     Status: Abnormal   Collection Time: 08/29/21  8:17 AM  Result Value Ref Range   Glucose-Capillary 148 (H) 70 - 99 mg/dL    Comment: Glucose reference range applies only to samples taken after fasting for at least 8 hours.  Glucose, capillary     Status: Abnormal   Collection Time: 08/29/21 10:16 AM  Result Value Ref Range   Glucose-Capillary 144 (H) 70 - 99 mg/dL    Comment: Glucose reference range applies only to samples taken after fasting for at least 8 hours.    Radiology: No results found.  No results found.  No results found.    Assessment and Plan: Patient Active Problem List   Diagnosis Date Noted   OSA on CPAP 09/25/2021   CPAP use counseling 09/25/2021   OSA (obstructive sleep apnea) 05/12/2021   Benign essential HTN 04/04/2021   Pneumonia  due to COVID-19 virus 09/21/2020   T2DM (type 2 diabetes mellitus) ([place]) 09/21/2020   Obesity 09/21/2020   Hypercholesterolemia 02/23/2020   Primary osteoarthritis of both knees 02/22/2020   1. OSA on CPAP The patient does tolerate PAP and reports  benefit from PAP use. He had recent septoplasty/turbinoplasty, and has been cleared to resume cpap use, but feels he may need a lower pressure. In light of his marked improvement in breathing, I have recommended a split study, as I think it is possible he may have resolved the apnea. He would benefit from titration in light of the surgery. The patient was reminded how to clean equipment  and advised to replace supplies routinely. The patient was also counselled on weight loss. The compliance is poor (due to surgery) . The AHI is 0.8 (prior to surgery) .   OSA_ needs split study to determine whether still has osa and determine optimal treatment pressure  2. CPAP use counseling CPAP Counseling: had a lengthy discussion with the patient regarding the importance of PAP therapy in management of the sleep apnea. Patient appears to understand the risk factor reduction and also understands the risks associated with untreated sleep apnea. Patient will try to make a good faith effort to remain compliant with therapy. Also instructed the patient on proper cleaning of the device including the water must be changed daily if possible and use of distilled water is preferred. Patient understands that the machine should be regularly cleaned with appropriate recommended cleaning solutions that do not damage the PAP machine for example given white  vinegar and water rinses. Other methods such as ozone treatment may not be as good as these simple methods to achieve cleaning.      General Counseling: I have discussed the findings of the evaluation and examination with Jack Barajas.  I have also discussed any further diagnostic evaluation thatmay be needed or ordered today. Jack Barajas  verbalizes understanding of the findings of todays visit. We also reviewed his medications today and discussed drug interactions and side effects including but not limited excessive drowsiness and altered mental states. We also discussed that there is always a risk not just to him but also people around him. he has been encouraged to call the office with any questions or concerns that should arise related to todays visit.  No orders of the defined types were placed in this encounter.       I have personally obtained a history, examined the patient, evaluated laboratory and imaging results, formulated the assessment and plan and placed orders. This patient was seen today by Tressie Ellis, PA-C in collaboration with Dr. Devona Konig.   Allyne Gee, MD [facility] Diplomate ABMS Pulmonary Critical Care Medicine and Sleep Medicine

## 2021-09-25 ENCOUNTER — Ambulatory Visit (INDEPENDENT_AMBULATORY_CARE_PROVIDER_SITE_OTHER): Payer: 59 | Admitting: Internal Medicine

## 2021-09-25 VITALS — BP 156/94 | HR 91 | Resp 16 | Ht 72.0 in | Wt 300.0 lb

## 2021-09-25 DIAGNOSIS — G4733 Obstructive sleep apnea (adult) (pediatric): Secondary | ICD-10-CM

## 2021-09-25 DIAGNOSIS — Z9989 Dependence on other enabling machines and devices: Secondary | ICD-10-CM

## 2021-09-25 DIAGNOSIS — Z7189 Other specified counseling: Secondary | ICD-10-CM

## 2021-09-26 ENCOUNTER — Telehealth: Payer: Self-pay

## 2021-09-26 NOTE — Telephone Encounter (Signed)
Due to patient "No showing" new patient appointment we are unable to reschedule with [facility].

## 2021-10-26 ENCOUNTER — Ambulatory Visit
Admission: RE | Admit: 2021-10-26 | Discharge: 2021-10-26 | Disposition: A | Discharge status: Home / Self Care | Payer: 59 | Source: Ambulatory Visit

## 2021-10-26 ENCOUNTER — Other Ambulatory Visit: Payer: Self-pay

## 2021-10-26 VITALS — BP 149/93 | HR 82 | Temp 97.9°F | Resp 18 | Ht 72.0 in | Wt 285.0 lb

## 2021-10-26 DIAGNOSIS — R21 Rash and other nonspecific skin eruption: Secondary | ICD-10-CM

## 2021-10-26 MED ORDER — METHYLPREDNISOLONE SODIUM SUCC 40 MG IJ SOLR
60.0000 mg | Freq: Once | INTRAMUSCULAR | Status: AC
  Administered 2021-10-26: 60 mg via INTRAMUSCULAR

## 2021-10-26 MED ORDER — PREDNISONE 10 MG (21) PO TBPK: ORAL_TABLET | Freq: Every day | ORAL | 0 refills | Status: DC

## 2021-10-26 NOTE — ED Provider Notes (Signed)
[facility]    CSN: [id number] Arrival date & time: 10/26/21  1801      History   Chief Complaint Chief Complaint  Patient presents with   Rash    HPI ABBOTT JASINSKI III is a 56 y.o. male.   Patient presents with erythematous pruritic rash present to the bilateral legs, arms and abdomen and chest for 2-1/2 weeks.  Rash has not spread.  Patient endorses that symptoms began 1 day after a colonoscopy on 10/02/2021 however patient has had colonoscopy in November 5361 without complication.  Denies changes in diet, recent travel, changes in lotions, soaps, detergents, fragrances, linens.  No other members of household has rash.  Has attempted use of Benadryl, calamine lotion, hydrocortisone and other topical products that can be found at a drugstore.  Denies drainage, fever, chills,, shortness of breath, chest pain or tightness, wheezing, difficulty breathing or swallowing.  History of diabetes, hypertension, sleep apnea.     Past Medical History:  Diagnosis Date   COVID-19 09/2020   hospitalized-followed by [facility] Gins   Diabetes mellitus without complication ([place])    Dyspnea    GERD (gastroesophageal reflux disease)    Hypertension    Sleep apnea    uses cpap    Patient Active Problem List   Diagnosis Date Noted   OSA on CPAP 09/25/2021   CPAP use counseling 09/25/2021   OSA (obstructive sleep apnea) 05/12/2021   Benign essential HTN 04/04/2021   Pneumonia due to COVID-19 virus 09/21/2020   T2DM (type 2 diabetes mellitus) ([place]) 09/21/2020   Obesity 09/21/2020   Hypercholesterolemia 02/23/2020   Primary osteoarthritis of both knees 02/22/2020    Past Surgical History:  Procedure Laterality Date   COLONOSCOPY N/A 08/14/2021   Procedure: COLONOSCOPY;  Surgeon: Annamaria Helling, DO;  Location: [place];  Service: Gastroenterology;  Laterality: N/A;   HAND SURGERY Right    fracture   NASAL SEPTOPLASTY W/ TURBINOPLASTY Bilateral 08/29/2021   Procedure:  NASAL SEPTOPLASTY WITH TURBINATE REDUCTION;  Surgeon: Beverly Gust, MD;  Location: [facility] ORS;  Service: ENT;  Laterality: Bilateral;   TOE SURGERY     TONSILLECTOMY     WRIST SURGERY         Home Medications    Prior to Admission medications   Medication Sig Start Date End Date Taking? Authorizing Provider  Cyanocobalamin (VITAMIN B 12 PO) Take 1 tablet by mouth 2 (two) times daily.   Yes [provider]  fluticasone (FLONASE) 50 MCG/ACT nasal spray Place 1 spray into both nostrils daily. 10/02/20  Yes Ezekiel Slocumb, DO  Fluticasone-Umeclidin-Vilant (TRELEGY ELLIPTA) 100-62.5-25 MCG/ACT AEPB Inhale 1 puff into the lungs at bedtime.   Yes [provider]  glucosamine-chondroitin 500-400 MG tablet Take 1 tablet by mouth daily.   Yes [provider]  insulin detemir (LEVEMIR) 100 UNIT/ML FlexPen Inject 23 Units into the skin daily. Patient taking differently: Inject 24 Units into the skin at bedtime. 10/01/20  Yes Nicole Kindred A, DO  Insulin Pen Needle (PEN NEEDLES) 30G X 8 MM MISC 1 each by Does not apply route 4 (four) times daily -  with meals and at bedtime. 10/01/20  Yes Nicole Kindred A, DO  lovastatin (MEVACOR) 10 MG tablet Take 10 mg by mouth at bedtime.   Yes [provider]  metFORMIN (GLUCOPHAGE-XR) 500 MG 24 hr tablet Take 500 mg by mouth at bedtime. 02/23/20  Yes [provider]  Multiple Vitamin (MULTIVITAMIN WITH MINERALS) TABS tablet Take 1  tablet by mouth daily.   Yes [provider]  Semaglutide, 1 MG/DOSE, (OZEMPIC, 1 MG/DOSE,) 2 MG/1.5ML SOPN Inject into the skin once a week.   Yes [provider]  VITAMIN D PO Take 1 tablet by mouth 2 (two) times daily.   Yes [provider]  cetirizine (ZYRTEC) 10 MG tablet Take 10 mg by mouth daily.    [provider]  cetirizine (ZYRTEC) 10 MG tablet Take by mouth. 01/04/21   [provider]  cyanocobalamin 1000 MCG tablet Take 2 tablets  daily for 2 weeks, then reduce to 1 tablet daily thereafter for Vitamin B12 Deficiency. 03/27/21   [provider]  EPINEPHrine 0.3 mg/0.3 mL IJ SOAJ injection Inject into the muscle. 02/10/21   [provider]  fluticasone (FLONASE) 50 MCG/ACT nasal spray Place into the nose. 03/06/21   [provider]  HYDROcodone-Acetaminophen 5-300 MG TABS Take 1-2 tablets by mouth every 4 (four) hours as needed. 08/29/21   Beverly Gust, MD  insulin aspart (NOVOLOG) 100 UNIT/ML FlexPen Inject 14 Units into the skin 3 (three) times daily with meals. Patient not taking: Reported on 08/24/2021 10/01/20   Nicole Kindred A, DO  insulin lispro (HUMALOG KWIKPEN) 100 UNIT/ML KwikPen Inject into the skin. 04/27/21   [provider]  Ipratropium-Albuterol (COMBIVENT) 20-100 MCG/ACT AERS respimat Inhale 1 puff into the lungs every 6 (six) hours. Patient not taking: Reported on 08/24/2021 10/01/20   Nicole Kindred A, DO  LANTUS SOLOSTAR 100 UNIT/ML Solostar Pen Inject into the skin. 09/01/21   [provider]  metFORMIN (GLUCOPHAGE-XR) 500 MG 24 hr tablet TAKE 1 TABLET(500 MG) BY MOUTH DAILY WITH DINNER 03/07/21   [provider]  albuterol (PROVENTIL HFA;VENTOLIN HFA) 108 (90 Base) MCG/ACT inhaler Inhale 2 puffs into the lungs every 4 (four) hours as needed. 03/21/18 09/15/20  Marylene Land, NP    Family History Family History  Problem Relation Age of Onset   Hypertension Mother    Cancer Father        bone    Social History Social History   Tobacco Use   Smoking status: Never   Smokeless tobacco: Never  Vaping Use   Vaping Use: Never used  Substance Use Topics   Alcohol use: Yes    Comment: occassionally   Drug use: No     Allergies   Patient has no known allergies.   Review of Systems Review of Systems  Constitutional: Negative.   HENT: Negative.    Respiratory: Negative.    Cardiovascular: Negative.   Skin:  Positive for rash. Negative for  color change, pallor and wound.  Neurological: Negative.     Physical Exam Triage Vital Signs ED Triage Vitals  Enc Vitals Group     BP 10/26/21 1816 (!) 149/93     Pulse Rate 10/26/21 1816 82     Resp 10/26/21 1816 18     Temp 10/26/21 1816 97.9 F (36.6 C)     Temp Source 10/26/21 1816 Oral     SpO2 10/26/21 1816 96 %     Weight 10/26/21 1818 285 lb (129.3 kg)     Height 10/26/21 1818 6' (1.829 m)     Head Circumference --      Peak Flow --      Pain Score 10/26/21 1818 0     Pain Loc --      Pain Edu? --      Excl. in [place]? --    No data  found.  Updated Vital Signs BP (!) 149/93 (BP Location: Right Arm)    Pulse 82    Temp 97.9 F (36.6 C) (Oral)    Resp 18    Ht 6' (1.829 m)    Wt 285 lb (129.3 kg)    SpO2 96%    BMI 38.65 kg/m   Visual Acuity Right Eye Distance:   Left Eye Distance:   Bilateral Distance:    Right Eye Near:   Left Eye Near:    Bilateral Near:     Physical Exam Constitutional:      Appearance: Normal appearance.  Eyes:     Extraocular Movements: Extraocular movements intact.  Pulmonary:     Effort: Pulmonary effort is normal.  Skin:    Comments: Erythematous papular rash spread over bilateral arms, bilateral legs, chest and abdomen with scattered excoriations, nontender, nondraining  Neurological:     Mental Status: He is alert and oriented to person, place, and time. Mental status is at baseline.  Psychiatric:        Mood and Affect: Mood normal.        Behavior: Behavior normal.     UC Treatments / Results  Labs (all labs ordered are listed, but only abnormal results are displayed) Labs Reviewed - No data to display  EKG   Radiology No results found.  Procedures Procedures (including critical care time)  Medications Ordered in UC Medications - No data to display  Initial Impression / Assessment and Plan / UC Course  I have reviewed the triage vital signs and the nursing notes.  Pertinent labs & imaging results that were  available during my care of the patient were reviewed by me and considered in my medical decision making (see chart for details).  Rash  Etiology of symptoms is unknown at this time, discussed with patient, will treat symptoms, methylprednisolone injection given in office, prednisone 60 mg taper prescribed, discussed effects of steroids on blood sugar, patient would like to move forward with treatment, patient instructed to monitor glucose closely at home and strictly adhere to diabetes diet, has upcoming appointment with PCP in 1 week, patient to follow-up with rash at that time and monitoring of glucose, may continue use of antihistamines for itching as well as over-the-counter topical products for further supportive care, urgent care follow-up as needed Final Clinical Impressions(s) / UC Diagnoses   Final diagnoses:  None   Discharge Instructions   None    ED Prescriptions   None    PDMP not reviewed this encounter.   Hans Eden, NP 10/26/21 1850

## 2021-10-26 NOTE — ED Triage Notes (Addendum)
Pt reports had a colonoscopy 1/16, that night after had "red whelps", pt reports since procedure has had the rash, has tried OTC benadryl and different remedies from Web to reduce reaction with some relief.   Pt reports itching. Rash to bilateral arms and LE.   NKA, denies new lotions, soaps, detergents, food, or clothing.   PCP told pt to come to UC, appt too far out, has not contacted GI regarding rash.   Pt denies any recent colds or viruses.

## 2021-10-26 NOTE — Discharge Instructions (Addendum)
The cause of your rash is unknown at this time however today we will move forward with treatment of the symptoms  You have been given an injection tonight to help calm the rash  Tomorrow begin use of prednisone every morning with food as directed on packaging, this medication is going to increase your blood sugars, please monitor closely at home and watch your diet   Keep your upcoming appointment with your primary care doctor for reevaluation of your rash as well as your blood sugars  May continue use of Benadryl, Claritin or Zyrtec to help with management of itching  Can continue use of any topical ointments to further help with symptoms  May follow-up with urgent care as needed

## 2022-01-02 ENCOUNTER — Ambulatory Visit
Admission: EM | Admit: 2022-01-02 | Discharge: 2022-01-02 | Disposition: A | Discharge status: Home / Self Care | Payer: 59 | Attending: Urgent Care | Admitting: Urgent Care

## 2022-01-02 ENCOUNTER — Ambulatory Visit (INDEPENDENT_AMBULATORY_CARE_PROVIDER_SITE_OTHER): Payer: 59

## 2022-01-02 DIAGNOSIS — G4733 Obstructive sleep apnea (adult) (pediatric): Secondary | ICD-10-CM | POA: Insufficient documentation

## 2022-01-02 DIAGNOSIS — R509 Fever, unspecified: Secondary | ICD-10-CM | POA: Diagnosis not present

## 2022-01-02 DIAGNOSIS — R059 Cough, unspecified: Secondary | ICD-10-CM

## 2022-01-02 DIAGNOSIS — Z8616 Personal history of COVID-19: Secondary | ICD-10-CM | POA: Diagnosis not present

## 2022-01-02 DIAGNOSIS — J22 Unspecified acute lower respiratory infection: Secondary | ICD-10-CM

## 2022-01-02 DIAGNOSIS — Z20822 Contact with and (suspected) exposure to covid-19: Secondary | ICD-10-CM | POA: Insufficient documentation

## 2022-01-02 DIAGNOSIS — J029 Acute pharyngitis, unspecified: Secondary | ICD-10-CM | POA: Diagnosis present

## 2022-01-02 LAB — CBC WITH DIFFERENTIAL/PLATELET
Abs Immature Granulocytes: 0.12 10*3/uL — ABNORMAL HIGH (ref 0.00–0.07)
Basophils Absolute: 0.1 10*3/uL (ref 0.0–0.1)
Basophils Relative: 0 %
Eosinophils Absolute: 0 10*3/uL (ref 0.0–0.5)
Eosinophils Relative: 0 %
HCT: 41.7 % (ref 39.0–52.0)
Hemoglobin: 13.4 g/dL (ref 13.0–17.0)
Immature Granulocytes: 1 %
Lymphocytes Relative: 8 %
Lymphs Abs: 1.3 10*3/uL (ref 0.7–4.0)
MCH: 28.7 pg (ref 26.0–34.0)
MCHC: 32.1 g/dL (ref 30.0–36.0)
MCV: 89.3 fL (ref 80.0–100.0)
Monocytes Absolute: 1.2 10*3/uL — ABNORMAL HIGH (ref 0.1–1.0)
Monocytes Relative: 8 %
Neutro Abs: 13.5 10*3/uL — ABNORMAL HIGH (ref 1.7–7.7)
Neutrophils Relative %: 83 %
Platelets: 324 10*3/uL (ref 150–400)
RBC: 4.67 MIL/uL (ref 4.22–5.81)
RDW: 13.8 % (ref 11.5–15.5)
WBC: 16.2 10*3/uL — ABNORMAL HIGH (ref 4.0–10.5)
nRBC: 0 % (ref 0.0–0.2)

## 2022-01-02 LAB — COMPREHENSIVE METABOLIC PANEL
ALT: 33 U/L (ref 0–44)
AST: 36 U/L (ref 15–41)
Albumin: 4.7 g/dL (ref 3.5–5.0)
Alkaline Phosphatase: 67 U/L (ref 38–126)
Anion gap: 10 (ref 5–15)
BUN: 13 mg/dL (ref 6–20)
CO2: 24 mmol/L (ref 22–32)
Calcium: 9.4 mg/dL (ref 8.9–10.3)
Chloride: 97 mmol/L — ABNORMAL LOW (ref 98–111)
Creatinine, Ser: 1.04 mg/dL (ref 0.61–1.24)
GFR, Estimated: >60 mL/min (ref >60)
Glucose, Bld: 130 mg/dL — ABNORMAL HIGH (ref 70–99)
Potassium: 4.2 mmol/L (ref 3.5–5.1)
Sodium: 131 mmol/L — ABNORMAL LOW (ref 135–145)
Total Bilirubin: 0.6 mg/dL (ref 0.3–1.2)
Total Protein: 8.2 g/dL — ABNORMAL HIGH (ref 6.5–8.1)

## 2022-01-02 LAB — RESP PANEL BY RT-PCR (FLU A&B, COVID) ARPGX2
Influenza A by PCR: NEGATIVE
Influenza B by PCR: NEGATIVE
SARS Coronavirus 2 by RT PCR: NEGATIVE

## 2022-01-02 MED ORDER — MOXIFLOXACIN HCL 400 MG PO TABS: 400.0000 mg | ORAL_TABLET | Freq: Every day | ORAL | 0 refills | Status: AC

## 2022-01-02 MED ORDER — ACETAMINOPHEN 500 MG PO TABS
1000.0000 mg | ORAL_TABLET | Freq: Once | ORAL | Status: AC
  Administered 2022-01-02: 1000 mg via ORAL

## 2022-01-02 NOTE — ED Triage Notes (Signed)
Pt c/o cough x1week.  ? ?Pt states that he has been taking mucinex for a cough and it worsened last night. Pt states that it felt like his chest was on fire.  ? ? ? ?

## 2022-01-02 NOTE — ED Provider Notes (Signed)
?[facility] ? ? ? ?CSN: [id number] ?Arrival date & time: 01/02/22  0803 ? ? ?  ? ?History   ?Chief Complaint ?Chief Complaint  ?Patient presents with  ? Sore Throat  ? ? ?HPI ?Jack Barajas is a 56 y.o. male.  ? ?Pleasant 56 year old male presents today with concerns of cough x1 week and a new onset of a fever last evening.  He states he has a longstanding history of recurrent pneumonia, the last one he had was concurrent with COVID.  He also states that in December 2022 he had a deviated septum and sinus surgery.  He does have a history of OSA and is supposed to wear CPAP, but since his surgery he states that "setting is too strong", thus does not use it anymore.  He denies a history of asthma or COPD, but does have an inhaler on hand "just in case".  He did use 1 puff last evening and did not find it helpful.  He has not checked his temperature at home but states he had hot cold chills starting just last evening.  He reports it seemed to start out in his head with nasal and sinus congestion, but reports that has been draining and responding to Mucinex.  He reports his sinus congestion has improved, but now he is having a burning substernal chest pain when he coughs.  It is productive of green phlegm.  He denies cardiac chest pain or palpitations.  He denies PND or orthopnea.  He states he has not checked his blood sugar since he has been sick, but states it usually runs between 90 and 120. He has not taken anything OTC apart from mucinex. He denies any sick contacts/ exposures.  ? ? ?Sore Throat ? ? ?Past Medical History:  ?Diagnosis Date  ? COVID-19 09/2020  ? hospitalized-followed by [facility]  ? Diabetes mellitus without complication ([place])   ? Dyspnea   ? GERD (gastroesophageal reflux disease)   ? Hypertension   ? Sleep apnea   ? uses cpap  ? ? ?Patient Active Problem List  ? Diagnosis Date Noted  ? OSA on CPAP 09/25/2021  ? CPAP use counseling 09/25/2021  ? OSA (obstructive sleep apnea)  05/12/2021  ? Benign essential HTN 04/04/2021  ? Pneumonia due to COVID-19 virus 09/21/2020  ? T2DM (type 2 diabetes mellitus) ([employer]) 09/21/2020  ? Obesity 09/21/2020  ? Hypercholesterolemia 02/23/2020  ? Primary osteoarthritis of both knees 02/22/2020  ? ? ?Past Surgical History:  ?Procedure Laterality Date  ? COLONOSCOPY N/A 08/14/2021  ? Procedure: COLONOSCOPY;  Surgeon: Annamaria Helling, DO;  Location[facility] ENDOSCOPY;  Service: Gastroenterology;  Laterality: N/A;  ? HAND SURGERY Right   ? fracture  ? NASAL SEPTOPLASTY W/ TURBINOPLASTY Bilateral 08/29/2021  ? Procedure: NASAL SEPTOPLASTY WITH TURBINATE REDUCTION;  Surgeon: Beverly Gust, MD;  Location: [facility] ORS;  Service: ENT;  Laterality: Bilateral;  ? TOE SURGERY    ? TONSILLECTOMY    ? WRIST SURGERY    ? ? ? ? ? ?Home Medications   ? ?Prior to Admission medications   ?Medication Sig Start Date End Date Taking? Authorizing Provider  ?moxifloxacin (AVELOX) 400 MG tablet Take 1 tablet (400 mg total) by mouth daily at 8 pm for 7 days. 01/02/22 01/09/22 Yes Octavio Matheney L, PA  ?Cyanocobalamin (VITAMIN B 12 PO) Take 1 tablet by mouth 2 (two) times daily.    [provider]  ?cyanocobalamin 1000 MCG tablet Take 2 tablets daily for 2  weeks, then reduce to 1 tablet daily thereafter for Vitamin B12 Deficiency. 03/27/21   [provider]  ?EPINEPHrine 0.3 mg/0.3 mL IJ SOAJ injection Inject into the muscle. 02/10/21   [provider]  ?fluticasone (FLONASE) 50 MCG/ACT nasal spray Place 1 spray into both nostrils daily. 10/02/20   Ezekiel Slocumb, DO  ?fluticasone (FLONASE) 50 MCG/ACT nasal spray Place into the nose. 03/06/21   [provider]  ?Fluticasone-Umeclidin-Vilant (TRELEGY ELLIPTA) 100-62.5-25 MCG/ACT AEPB Inhale 1 puff into the lungs at bedtime.    [provider]  ?glucosamine-chondroitin 500-400 MG tablet Take 1 tablet by mouth daily.    [provider]  ?insulin detemir (LEVEMIR) 100 UNIT/ML FlexPen  Inject 23 Units into the skin daily. ?Patient taking differently: Inject 24 Units into the skin at bedtime. 10/01/20   Ezekiel Slocumb, DO  ?Insulin Pen Needle (PEN NEEDLES) 30G X 8 MM MISC 1 each by Does not apply route 4 (four) times daily -  with meals and at bedtime. 10/01/20   Ezekiel Slocumb, DO  ?LANTUS SOLOSTAR 100 UNIT/ML Solostar Pen Inject into the skin. 09/01/21   [provider]  ?lovastatin (MEVACOR) 10 MG tablet Take 10 mg by mouth at bedtime.    [provider]  ?metFORMIN (GLUCOPHAGE-XR) 500 MG 24 hr tablet Take 500 mg by mouth at bedtime. 02/23/20   [provider]  ?metFORMIN (GLUCOPHAGE-XR) 500 MG 24 hr tablet TAKE 1 TABLET(500 MG) BY MOUTH DAILY WITH DINNER 03/07/21   [provider]  ?Multiple Vitamin (MULTIVITAMIN WITH MINERALS) TABS tablet Take 1 tablet by mouth daily.    [provider]  ?Semaglutide, 1 MG/DOSE, (OZEMPIC, 1 MG/DOSE,) 2 MG/1.5ML SOPN Inject into the skin once a week.    [provider]  ?VITAMIN D PO Take 1 tablet by mouth 2 (two) times daily.    [provider]  ?albuterol (PROVENTIL HFA;VENTOLIN HFA) 108 (90 Base) MCG/ACT inhaler Inhale 2 puffs into the lungs every 4 (four) hours as needed. 03/21/18 09/15/20  Marylene Land, NP  ? ? ?Family History ?Family History  ?Problem Relation Age of Onset  ? Hypertension Mother   ? Cancer Father   ?     bone  ? ? ?Social History ?Social History  ? ?Tobacco Use  ? Smoking status: Never  ? Smokeless tobacco: Never  ?Vaping Use  ? Vaping Use: Never used  ?Substance Use Topics  ? Alcohol use: Yes  ?  Comment: occassionally  ? Drug use: No  ? ? ? ?Allergies   ?Patient has no known allergies. ? ? ?Review of Systems ?Review of Systems ?As per hpi  ? ?Physical Exam ?Triage Vital Signs ?ED Triage Vitals  ?Enc Vitals Group  ?   BP 01/02/22 0814 127/84  ?   Pulse Rate 01/02/22 0814 (!) 131  ?   Resp 01/02/22 0814 18  ?   Temp 01/02/22 0814 (!) 102.7 ?F (39.3 ?C)  ?   Temp Source  01/02/22 0814 Oral  ?   SpO2 01/02/22 0814 93 %  ?   Weight 01/02/22 0811 275 lb (124.7 kg)  ?   Height 01/02/22 0811 6' (1.829 m)  ?   Head Circumference --   ?   Peak Flow --   ?   Pain Score 01/02/22 0811 10  ?   Pain Loc --   ?   Pain Edu? --   ?   Excl. in [place]? --   ? ?No data found. ? ?  Updated Vital Signs ?BP 127/84 (BP Location: Left Arm)   Pulse (!) 131   Temp (!) 102.7 ?F (39.3 ?C) (Oral)   Resp 18   Ht 6' (1.829 m)   Wt 275 lb (124.7 kg)   SpO2 93%   BMI 37.30 kg/m?  ? ?Visual Acuity ?Right Eye Distance:   ?Left Eye Distance:   ?Bilateral Distance:   ? ?Right Eye Near:   ?Left Eye Near:    ?Bilateral Near:    ? ?Physical Exam ?Vitals and nursing note reviewed.  ?Constitutional:   ?   General: He is not in acute distress. ?   Appearance: He is well-developed. He is obese. He is ill-appearing. He is not toxic-appearing or diaphoretic.  ?HENT:  ?   Head: Normocephalic and atraumatic.  ?   Right Ear: Tympanic membrane and ear canal normal. No drainage, swelling or tenderness. No middle ear effusion. Tympanic membrane is not erythematous.  ?   Left Ear: Tympanic membrane and ear canal normal. No drainage, swelling or tenderness.  No middle ear effusion. Tympanic membrane is not erythematous.  ?   Nose: No congestion or rhinorrhea.  ?   Mouth/Throat:  ?   Mouth: Mucous membranes are moist. No oral lesions.  ?   Pharynx: Oropharynx is clear. Uvula midline. No pharyngeal swelling, oropharyngeal exudate, posterior oropharyngeal erythema or uvula swelling.  ?   Tonsils: No tonsillar exudate or tonsillar abscesses.  ?Eyes:  ?   Extraocular Movements:  ?   Right eye: Normal extraocular motion.  ?   Left eye: Normal extraocular motion.  ?   Conjunctiva/sclera: Conjunctivae normal.  ?   Pupils: Pupils are equal, round, and reactive to light.  ?Cardiovascular:  ?   Rate and Rhythm: Regular rhythm. Tachycardia present.  ?   Heart sounds: Normal heart sounds. No murmur heard. ?  No friction rub. No gallop.   ?Pulmonary:  ?   Effort: Pulmonary effort is normal. No respiratory distress.  ?   Breath sounds: Rhonchi (bibasilar posterior lung fields) present.  ?Chest:  ?   Chest wall: No tenderness.  ?Abdominal:  ?   Palpations: Abdomen i

## 2022-01-02 NOTE — Discharge Instructions (Signed)
You are being treated today for suspected bacterial infection of the lungs. ?Your covid and flu test are negative. ?Your WBC count is >16. ?Please remain HYDRATED with water. ?Take Avelox one tablet daily for the next 7 days. ?There is a BBW on this medication- if any numbness or tingling, change in vision, or ankle/tendon pain, stop the medication immediately and call our office. ?Please follow up with PCP in 5 days for a recheck. ?Indications to head to the ER - worsening chest congestion, development of chest pain or shortness of breath, fever unresponsive to medications, or any new symptoms. ?You can alternate tylenol and ibuprofen every 4 hours. Tylenol '1000mg'$  and Ibuprofen '800mg'$  is max dose. ?

## 2022-02-22 IMAGING — CR DG CHEST 2V
2 series · 2 of 2 positions shown · non-contrast
Comparison: 03/21/2018

CLINICAL DATA: Cough for 3-4 months

EXAM:
CHEST - 2 VIEW

[chest pa]
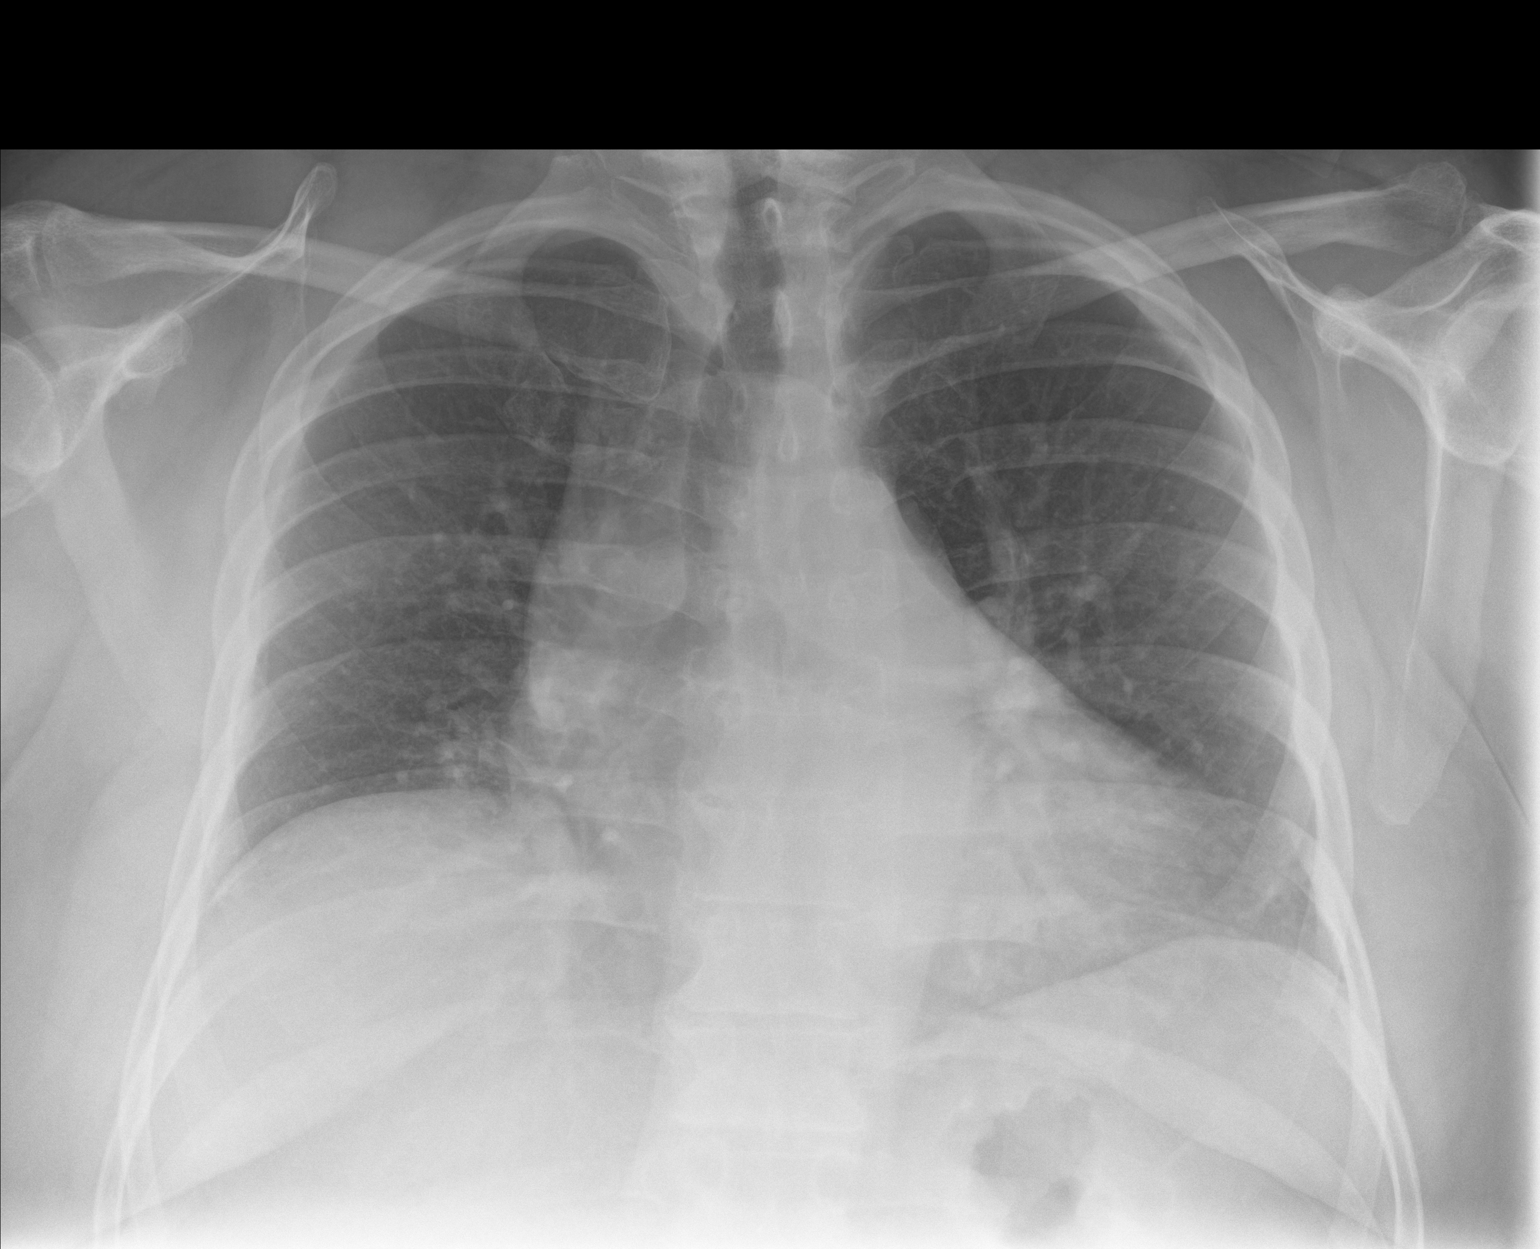

[chest lat]
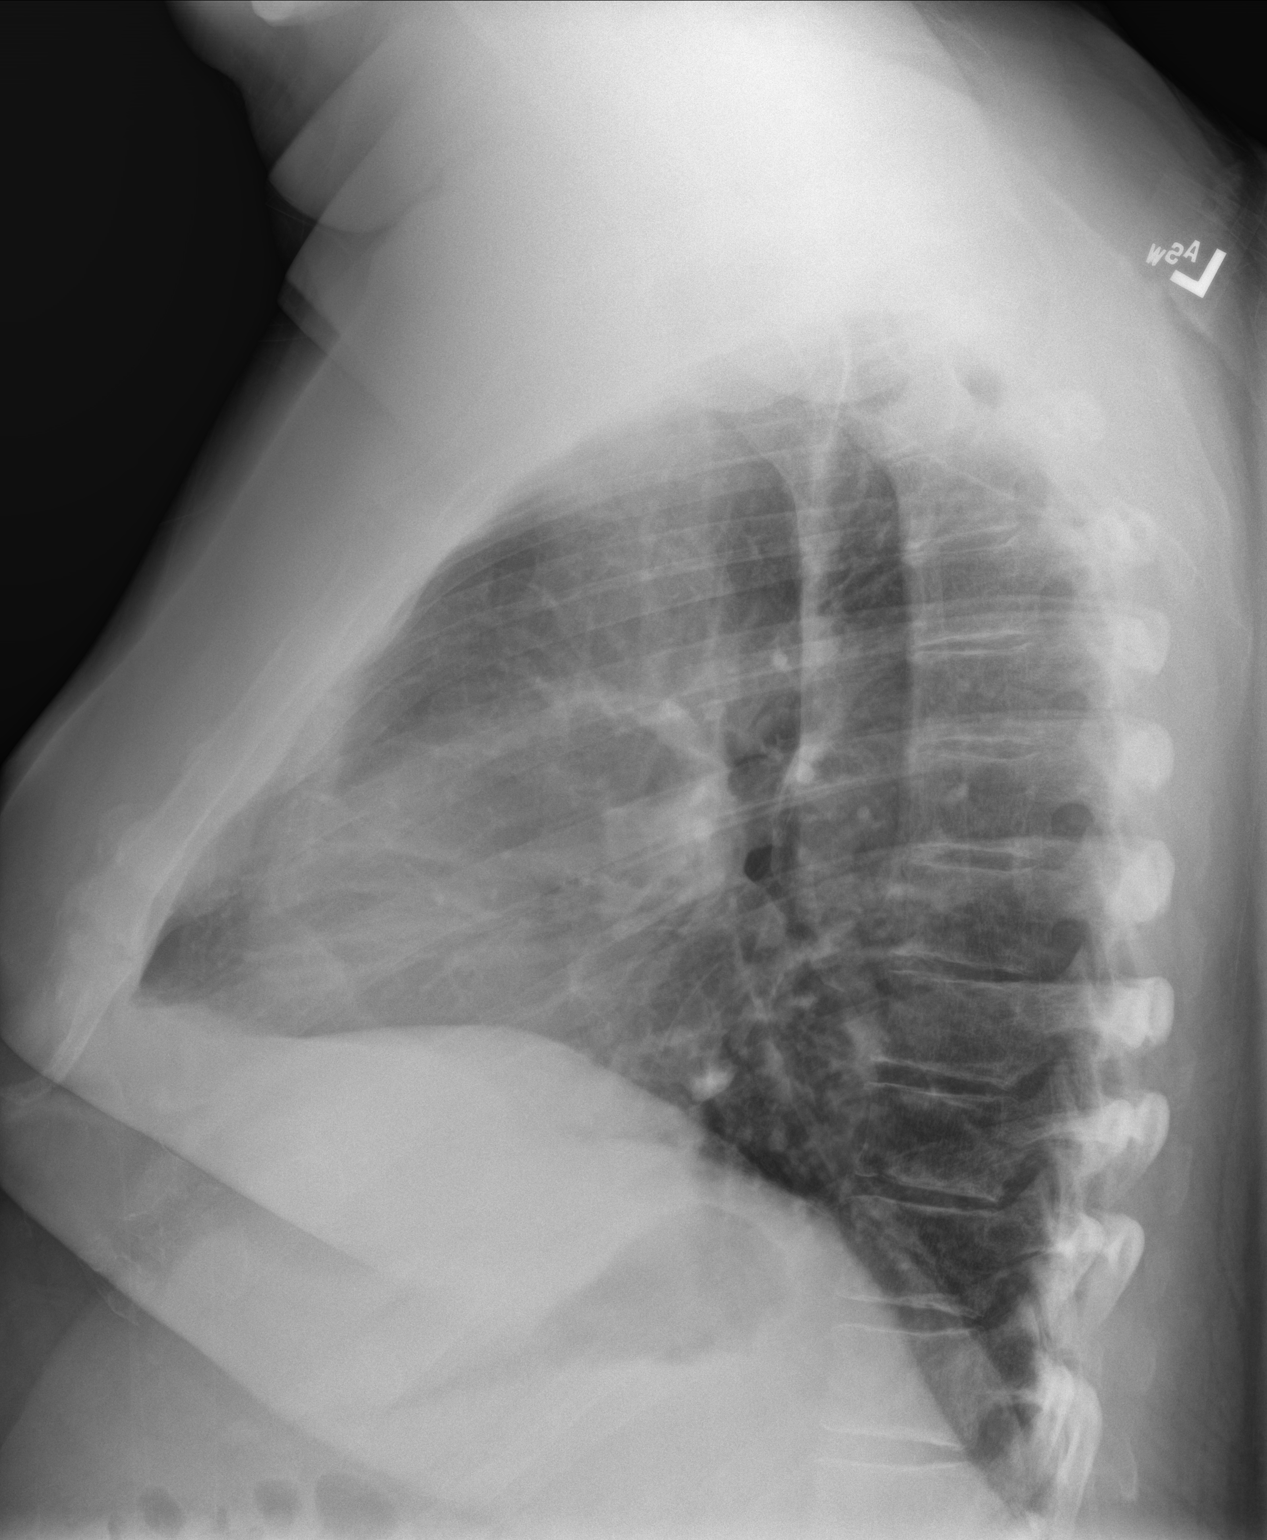

[2 of 2 positions shown; findings below may reference images not displayed]

FINDINGS: Patchy left lower lobe airspace disease which may reflect
atelectasis versus pneumonia. No pleural effusion or pneumothorax.
Heart and mediastinal contours are unremarkable.

No acute osseous abnormality.
IMPRESSION: Patchy left lower lobe airspace disease which may reflect
atelectasis versus pneumonia.

## 2022-02-28 IMAGING — CR DG CHEST 2V
1 series · 2 of 2 positions shown · non-contrast
Comparison: 09/15/2020

CLINICAL DATA: Dyspnea

EXAM:
CHEST - 2 VIEW

[Series 1: dg chest 2 view · 0.14mm/px · 2 of 2 slices shown]
[im 1/2]
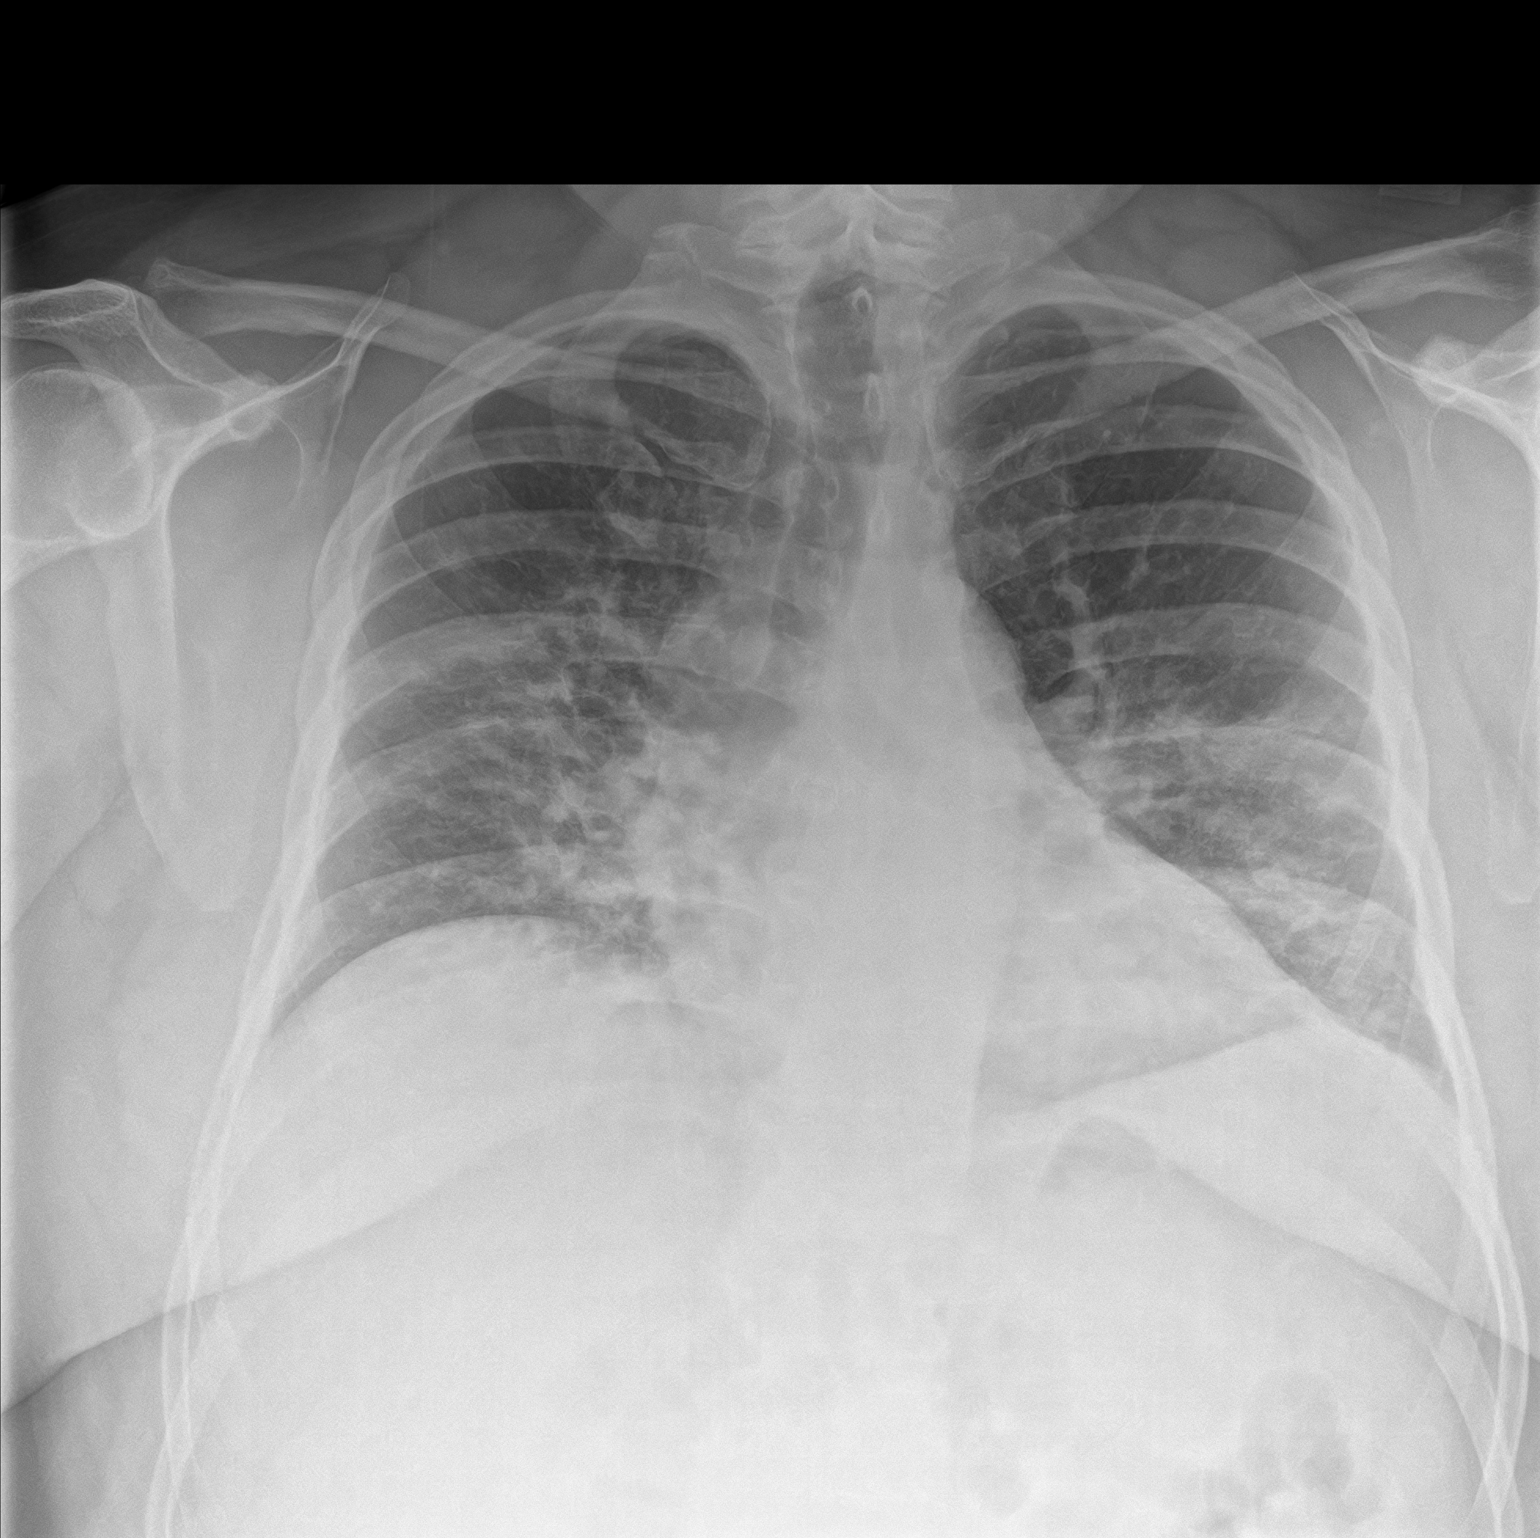
[im 2/2]
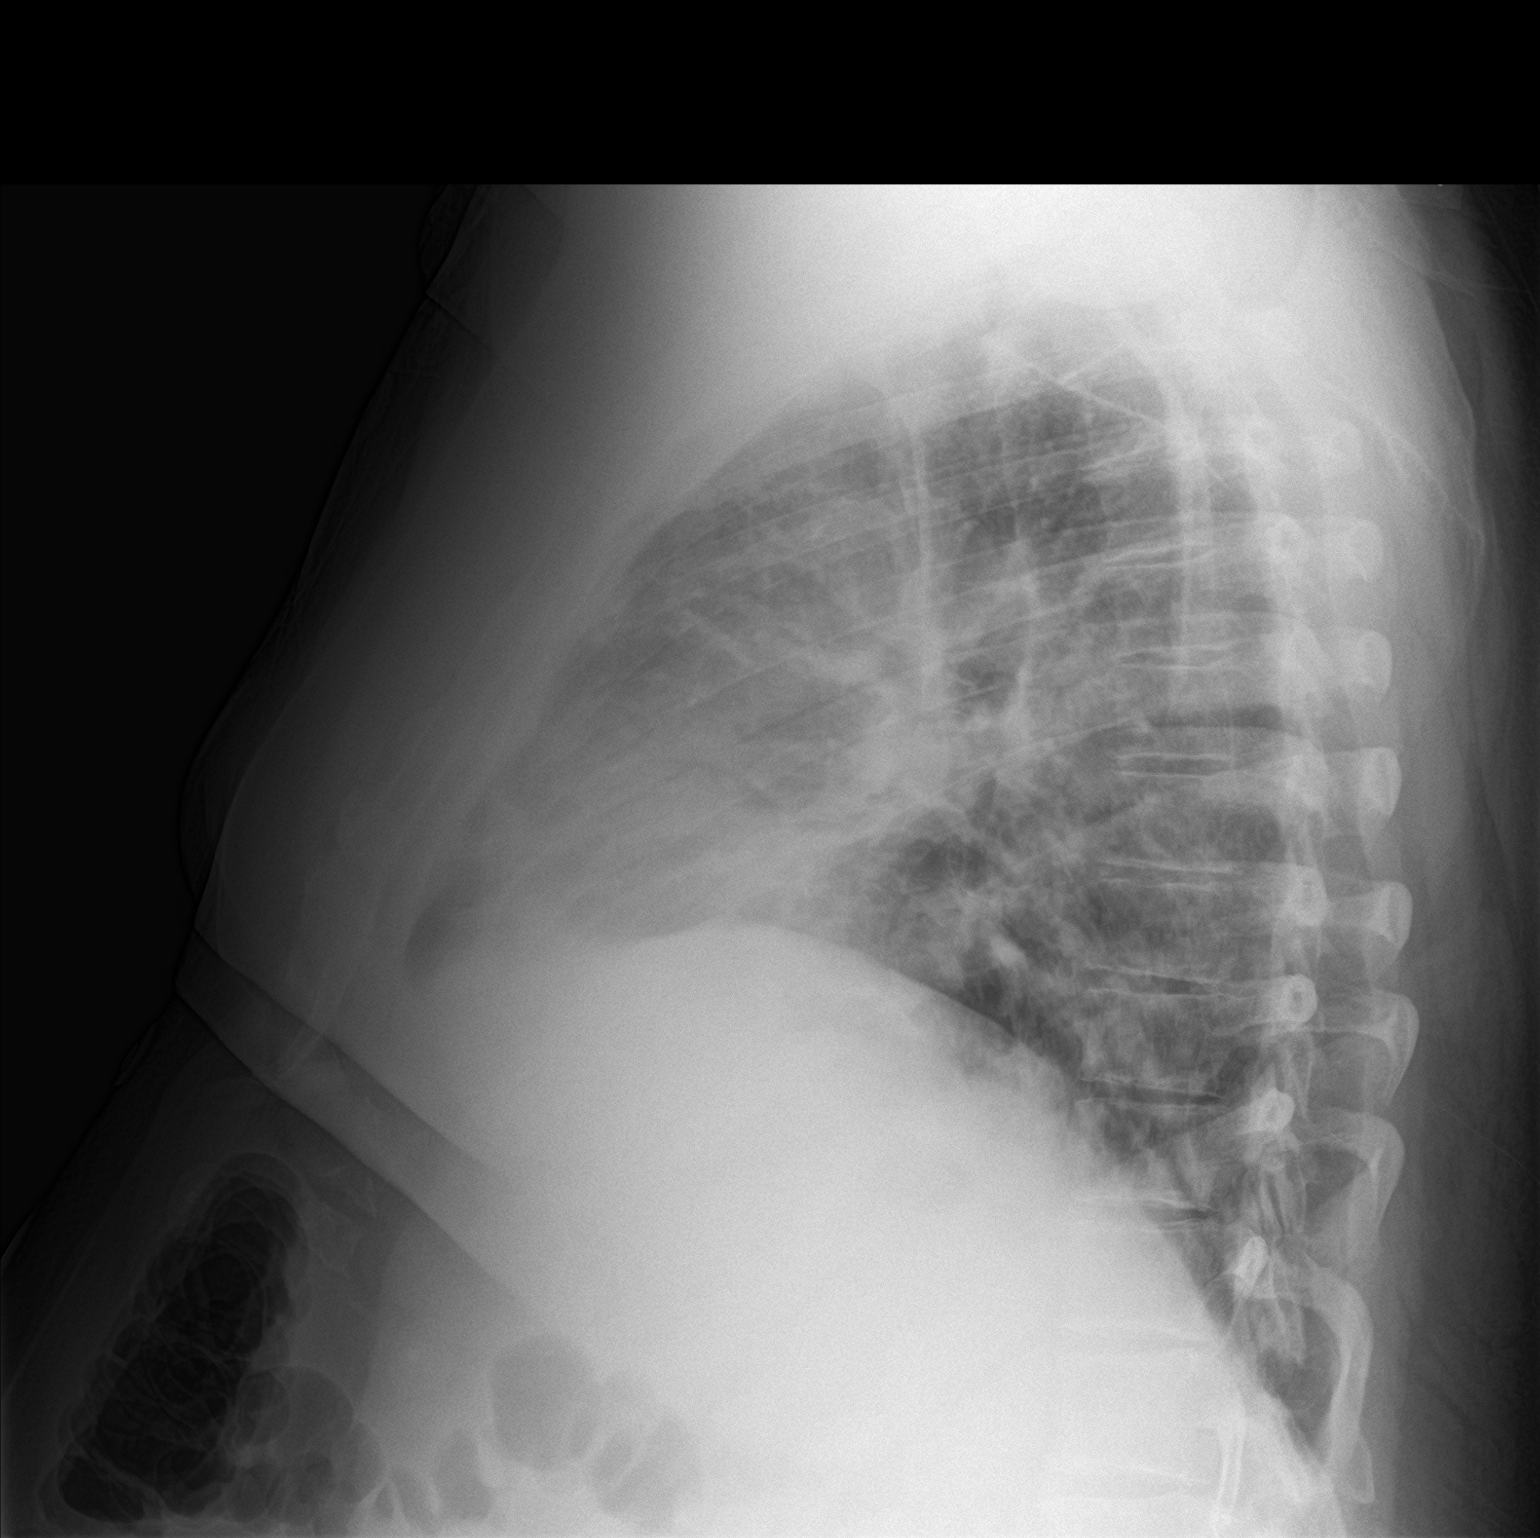

[2 of 2 positions shown; findings below may reference images not displayed]

FINDINGS: Lung volumes are small and pulmonary insufflation has diminished
since prior examination. Superimposed bilateral mid and lower lung
zone patchy pulmonary infiltrates have developed, likely infectious
in etiology. No pneumothorax or pleural effusion. Cardiac size
within normal limits. No acute bone abnormality.
IMPRESSION: Interval development of bilateral pulmonary infiltrates, likely
infectious.

Progressive pulmonary hypoinflation.

## 2022-10-16 ENCOUNTER — Other Ambulatory Visit: Payer: Self-pay

## 2022-10-16 ENCOUNTER — Ambulatory Visit: Payer: 59 | Admitting: Urology

## 2022-10-16 ENCOUNTER — Other Ambulatory Visit
Admission: RE | Admit: 2022-10-16 | Discharge: 2022-10-16 | Disposition: A | Discharge status: Home / Self Care | Payer: 59 | Attending: Urology | Admitting: Urology

## 2022-10-16 ENCOUNTER — Encounter: Payer: Self-pay | Admitting: Urology

## 2022-10-16 VITALS — BP 144/88 | HR 87 | Ht 72.0 in | Wt 284.0 lb

## 2022-10-16 DIAGNOSIS — R361 Hematospermia: Secondary | ICD-10-CM

## 2022-10-16 LAB — URINALYSIS, COMPLETE (UACMP) WITH MICROSCOPIC
Bilirubin Urine: NEGATIVE
Glucose, UA: NEGATIVE mg/dL
Hgb urine dipstick: NEGATIVE
Ketones, ur: NEGATIVE mg/dL
Leukocytes,Ua: NEGATIVE
Nitrite: NEGATIVE
Protein, ur: NEGATIVE mg/dL
Specific Gravity, Urine: >1.03 — ABNORMAL HIGH (ref 1.005–1.030)
pH: 5 (ref 5.0–8.0)

## 2022-10-16 NOTE — Progress Notes (Signed)
   10/16/22 3:29 PM   Cira Rue III 04-Feb-1966 [id number]  CC: Hematospermia  HPI: 57 year old male referred for hematospermia.  He had a single episode about a year ago that resolved spontaneously, then noticed blood in the semen again in October and November 2023.  This is since resolved.  He denies any gross hematuria or urinary symptoms.  PSA normal at 1.33, urinalysis today benign.  He denies any gross hematuria.   PMH: Past Medical History:  Diagnosis Date   COVID-19 09/2020   hospitalized-followed by [facility]   Diabetes mellitus without complication ([place])    Dyspnea    GERD (gastroesophageal reflux disease)    Hypertension    Sleep apnea    uses cpap    Surgical History: Past Surgical History:  Procedure Laterality Date   COLONOSCOPY N/A 08/14/2021   Procedure: COLONOSCOPY;  Surgeon: Annamaria Helling, DO;  Location: [facility] ENDOSCOPY;  Service: Gastroenterology;  Laterality: N/A;   HAND SURGERY Right    fracture   NASAL SEPTOPLASTY W/ TURBINOPLASTY Bilateral 08/29/2021   Procedure: NASAL SEPTOPLASTY WITH TURBINATE REDUCTION;  Surgeon: Beverly Gust, MD;  Location: [facility] ORS;  Service: ENT;  Laterality: Bilateral;   TOE SURGERY     TONSILLECTOMY     WRIST SURGERY       Family History: Family History  Problem Relation Age of Onset   Hypertension Mother    Cancer Father        bone    Social History:  reports that he has never smoked. He has never used smokeless tobacco. He reports current alcohol use. He reports that he does not use drugs.  Physical Exam: BP (!) 144/88 (BP Location: Left Arm, Patient Position: Sitting, Cuff Size: Large)   Pulse 87   Ht 6' (1.829 m)   Wt 284 lb (128.8 kg)   BMI 38.52 kg/m    Constitutional:  Alert and oriented, No acute distress. Cardiovascular: No clubbing, cyanosis, or edema. Respiratory: Normal respiratory effort, no increased work of breathing. GI: Abdomen is soft, nontender, nondistended, no abdominal  masses   Assessment & Plan:   57 year old male with a few episodes of hematospermia over the last year that have since resolved, no urinary symptoms or gross hematuria, PSA and urinalysis normal.  We reviewed this is typically a benign condition that resolved spontaneously and no further workup is needed.  Limitations of DRE discussed, and he defers DRE today.  Return precautions including gross hematuria or worsening hematospermia discussed.  Follow-up with urology as needed  Nickolas Madrid, MD 10/16/2022  [place] [address] [place], [place] [address] [phone]

## 2022-10-16 NOTE — Patient Instructions (Signed)
Hematospermia(blood in the semen) is not uncommon in men, and is almost always a benign condition.  Your urine test today is completely normal, and recent PSA is also normal.  If you have blood in the urine, please contact urology, otherwise the hematospermia likely resolve spontaneously, but can also come and go depending on sexual activity.

## 2023-07-04 ENCOUNTER — Encounter: Payer: Self-pay | Admitting: Emergency Medicine

## 2023-07-04 ENCOUNTER — Ambulatory Visit
Admission: EM | Admit: 2023-07-04 | Discharge: 2023-07-04 | Disposition: A | Discharge status: Home / Self Care | Payer: 59 | Attending: Family | Admitting: Family

## 2023-07-04 DIAGNOSIS — R030 Elevated blood-pressure reading, without diagnosis of hypertension: Secondary | ICD-10-CM

## 2023-07-04 DIAGNOSIS — I10 Essential (primary) hypertension: Secondary | ICD-10-CM

## 2023-07-04 DIAGNOSIS — Z8701 Personal history of pneumonia (recurrent): Secondary | ICD-10-CM

## 2023-07-04 DIAGNOSIS — R051 Acute cough: Secondary | ICD-10-CM

## 2023-07-04 MED ORDER — DOXYCYCLINE HYCLATE 100 MG PO CAPS: 100.0000 mg | ORAL_CAPSULE | Freq: Two times a day (BID) | ORAL | 0 refills | Status: AC

## 2023-07-04 MED ORDER — ALBUTEROL SULFATE HFA 108 (90 BASE) MCG/ACT IN AERS
2.0000 | INHALATION_SPRAY | Freq: Four times a day (QID) | RESPIRATORY_TRACT | 1 refills | Status: AC | PRN
Start: 2023-07-04 — End: ?

## 2023-07-04 NOTE — ED Provider Notes (Signed)
[facility]    CSN: [id number] Arrival date & time: 07/04/23  1801      History   Chief Complaint Chief Complaint  Patient presents with   Cough    HPI Jack Barajas is a 57 y.o. male.   57 year old male presents with cough that started yesterday. Minimal congestion. Slightly irritated throat from coughing. Denies any fever, shortness of breath, nausea or vomiting. Has taken OTC Mucinex with some relief. Has history of recurrent pneumonia. Had severe case of pneumonia with COVID in January 2022 and then had another episode of pneumonia in April 2023. No tobacco use. Has history of sleep apnea and ?Asthma. Was on Trelegy Ellipta inhaler but has not used this is over a year. Has used albuterol inhaler in the past but not recently. Other chronic health issues include type 2 DM which is controlled with Semaglutide, hyperlipidemia which is controlled with Lovastatin and HTN in which he is not currently on any medication. Traveling out of town next week and wife is concerned about cough getting worse and developing pneumonia again while traveling.   The history is provided by the patient.    Past Medical History:  Diagnosis Date   COVID-19 09/2020   hospitalized-followed by Karna Christmas   Diabetes mellitus without complication (HCC)    Dyspnea    GERD (gastroesophageal reflux disease)    Hypertension    Sleep apnea    uses cpap    Patient Active Problem List   Diagnosis Date Noted   OSA on CPAP 09/25/2021   CPAP use counseling 09/25/2021   OSA (obstructive sleep apnea) 05/12/2021   Benign essential HTN 04/04/2021   Pneumonia due to COVID-19 virus 09/21/2020   T2DM (type 2 diabetes mellitus) (HCC) 09/21/2020   Obesity 09/21/2020   Hypercholesterolemia 02/23/2020   Primary osteoarthritis of both knees 02/22/2020    Past Surgical History:  Procedure Laterality Date   COLONOSCOPY N/A 08/14/2021   Procedure: COLONOSCOPY;  Surgeon: Jaynie Collins, DO;   Location: [facility] ENDOSCOPY;  Service: Gastroenterology;  Laterality: N/A;   HAND SURGERY Right    fracture   NASAL SEPTOPLASTY W/ TURBINOPLASTY Bilateral 08/29/2021   Procedure: NASAL SEPTOPLASTY WITH TURBINATE REDUCTION;  Surgeon: Linus Salmons, MD;  Location: [facility] ORS;  Service: ENT;  Laterality: Bilateral;   TOE SURGERY     TONSILLECTOMY     WRIST SURGERY         Home Medications    Prior to Admission medications   Medication Sig Start Date End Date Taking? Authorizing Provider  albuterol (VENTOLIN HFA) 108 (90 Base) MCG/ACT inhaler Inhale 2 puffs into the lungs every 6 (six) hours as needed for wheezing (or cough). 07/04/23  Yes Horst Ostermiller, Ali Lowe, NP  doxycycline (VIBRAMYCIN) 100 MG capsule Take 1 capsule (100 mg total) by mouth 2 (two) times daily for 7 days. 07/04/23 07/11/23 Yes Jabier Deese, Ali Lowe, NP  EPINEPHrine 0.3 mg/0.3 mL IJ SOAJ injection Inject into the muscle. 02/10/21   [provider]  fluticasone (FLONASE) 50 MCG/ACT nasal spray Place into the nose. 03/06/21   [provider]  Fluticasone-Umeclidin-Vilant (TRELEGY ELLIPTA) 100-62.5-25 MCG/ACT AEPB Inhale 1 puff into the lungs at bedtime.    [provider]  glucosamine-chondroitin 500-400 MG tablet Take 1 tablet by mouth daily.    [provider]  glucose blood (ACCU-CHEK GUIDE) test strip USE AS DIRECTED ONCE DAILY AND AS NEEDED 10/05/21   [provider]  Insulin Pen Needle (PEN NEEDLES) 30G  X 8 MM MISC 1 each by Does not apply route 4 (four) times daily -  with meals and at bedtime. 10/01/20   Pennie Banter, DO  lovastatin (MEVACOR) 10 MG tablet Take 10 mg by mouth at bedtime.    [provider]  Multiple Vitamin (MULTIVITAMIN WITH MINERALS) TABS tablet Take 1 tablet by mouth daily.    [provider]  Semaglutide, 1 MG/DOSE, (OZEMPIC, 1 MG/DOSE,) 2 MG/1.5ML SOPN Inject into the skin once a week.    [provider]  VITAMIN D PO Take 1 tablet by  mouth 2 (two) times daily.    [provider]    Family History Family History  Problem Relation Age of Onset   Hypertension Mother    Cancer Father        bone    Social History Social History   Tobacco Use   Smoking status: Never   Smokeless tobacco: Never  Vaping Use   Vaping status: Never Used  Substance Use Topics   Alcohol use: Yes    Comment: occassionally   Drug use: No     Allergies   Patient has no known allergies.   Review of Systems Review of Systems  Constitutional:  Negative for activity change, appetite change, chills, diaphoresis, fatigue and fever.  HENT:  Positive for congestion (minimal). Negative for ear discharge, ear pain, mouth sores, postnasal drip, rhinorrhea, sinus pressure, sinus pain, sore throat and trouble swallowing.   Eyes:  Negative for pain, discharge, redness and itching.  Respiratory:  Positive for cough. Negative for chest tightness, shortness of breath and wheezing.   Cardiovascular:  Negative for chest pain and palpitations.  Gastrointestinal:  Negative for nausea and vomiting.  Musculoskeletal:  Negative for arthralgias, myalgias, neck pain and neck stiffness.  Skin:  Negative for color change and rash.  Allergic/Immunologic: Negative for environmental allergies, food allergies and immunocompromised state.  Neurological:  Negative for dizziness, tremors, seizures, syncope, facial asymmetry, weakness, light-headedness, numbness and headaches.  Hematological:  Negative for adenopathy. Does not bruise/bleed easily.  Psychiatric/Behavioral:  Positive for sleep disturbance.      Physical Exam Triage Vital Signs ED Triage Vitals  Encounter Vitals Group     BP 07/04/23 1817 (!) 149/101     Systolic BP Percentile --      Diastolic BP Percentile --      Pulse Rate 07/04/23 1817 79     Resp 07/04/23 1817 18     Temp 07/04/23 1817 97.9 F (36.6 C)     Temp Source 07/04/23 1817 Oral     SpO2 07/04/23 1817 96 %     Weight  --      Height --      Head Circumference --      Peak Flow --      Pain Score 07/04/23 1816 0     Pain Loc --      Pain Education --      Exclude from Growth Chart --    No data found.  Updated Vital Signs BP (!) 149/101 (BP Location: Right Arm)   Pulse 79   Temp 97.9 F (36.6 C) (Oral)   Resp 18   SpO2 96%   Visual Acuity Right Eye Distance:   Left Eye Distance:   Bilateral Distance:    Right Eye Near:   Left Eye Near:    Bilateral Near:     Physical Exam Vitals and nursing note reviewed.  Constitutional:  General: He is awake. He is not in acute distress.    Appearance: He is well-developed and well-groomed. He is not ill-appearing.     Comments: He is sitting on the exam table in no acute distress with no increased work of breathing.   HENT:     Head: Normocephalic and atraumatic.     Right Ear: Hearing, tympanic membrane, ear canal and external ear normal.     Left Ear: Hearing, tympanic membrane, ear canal and external ear normal.     Nose: Nose normal. No congestion.     Right Sinus: No maxillary sinus tenderness or frontal sinus tenderness.     Left Sinus: No maxillary sinus tenderness or frontal sinus tenderness.     Mouth/Throat:     Lips: Pink.     Mouth: Mucous membranes are moist.     Pharynx: Oropharynx is clear. Uvula midline. Posterior oropharyngeal erythema present. No pharyngeal swelling, oropharyngeal exudate, uvula swelling or postnasal drip.  Eyes:     Extraocular Movements: Extraocular movements intact.     Conjunctiva/sclera: Conjunctivae normal.  Cardiovascular:     Rate and Rhythm: Normal rate and regular rhythm.     Heart sounds: Normal heart sounds. No murmur heard. Pulmonary:     Effort: Pulmonary effort is normal. No tachypnea, accessory muscle usage or respiratory distress.     Breath sounds: Normal air entry. No decreased air movement. Examination of the right-upper field reveals wheezing. Examination of the left-upper field  reveals wheezing. Wheezing present. No decreased breath sounds, rhonchi or rales.     Comments: Slight upper lobe wheezes heard mainly with coughing.  Musculoskeletal:     Cervical back: Normal range of motion and neck supple.  Lymphadenopathy:     Cervical: No cervical adenopathy.  Skin:    General: Skin is warm and dry.     Capillary Refill: Capillary refill takes less than 2 seconds.     Findings: No rash.  Neurological:     General: No focal deficit present.     Mental Status: He is alert and oriented to person, place, and time.  Psychiatric:        Mood and Affect: Mood normal.        Behavior: Behavior normal. Behavior is cooperative.        Thought Content: Thought content normal.        Judgment: Judgment normal.      UC Treatments / Results  Labs (all labs ordered are listed, but only abnormal results are displayed) Labs Reviewed - No data to display  EKG   Radiology No results found.  Procedures Procedures (including critical care time)  Medications Ordered in UC Medications - No data to display  Initial Impression / Assessment and Plan / UC Course  I have reviewed the triage vital signs and the nursing notes.  Pertinent labs & imaging results that were available during my care of the patient were reviewed by me and considered in my medical decision making (see chart for details).     Reviewed with patient that he has a mild cough and probably has a upper respiratory viral illness at this time. Recommend continue OTC Mucinex as directed. Restart Albuterol inhaler 2 puffs every 6 hours as needed for wheezing and cough. Continue to push fluids to help loosen up mucus in chest. Would consider oral Prednisone but blood pressure is not controlled at this time. Patient needs to see his PCP again in the next few weeks for management.  He is asymptomatic at this time. Due to his history of recurrent pneumonia and traveling to areas with limited health services, will  provide Rx for Doxycycline 100mg  twice a day for 7 days for patient to take if his symptoms become much worse (worsening cough, shortness of breath with any fever). Otherwise, follow-up with his PCP as planned.  Final Clinical Impressions(s) / UC Diagnoses   Final diagnoses:  Acute cough  History of pneumonia  Elevated blood pressure reading with diagnosis of hypertension     Discharge Instructions      Recommend continue OTC Mucinex as directed. May use Albuterol inhaler 2 puffs every 6 hours as needed. Continue to push fluids to help loosen up any mucus in chest. Due to history of pneumonia, if cough keeps getting worse or any fever develops, may start Doxycycline 100mg  twice a day for 7 days. Follow-up with your PCP as planned.     ED Prescriptions     Medication Sig Dispense Auth. Provider   albuterol (VENTOLIN HFA) 108 (90 Base) MCG/ACT inhaler Inhale 2 puffs into the lungs every 6 (six) hours as needed for wheezing (or cough). 18 g Sudie Grumbling, NP   doxycycline (VIBRAMYCIN) 100 MG capsule Take 1 capsule (100 mg total) by mouth 2 (two) times daily for 7 days. 14 capsule Lunden Mcleish, Ali Lowe, NP      PDMP not reviewed this encounter.   Sudie Grumbling, NP 07/05/23 1015

## 2023-07-04 NOTE — Discharge Instructions (Signed)
Recommend continue OTC Mucinex as directed. May use Albuterol inhaler 2 puffs every 6 hours as needed. Continue to push fluids to help loosen up any mucus in chest. Due to history of pneumonia, if cough keeps getting worse or any fever develops, may start Doxycycline 100mg  twice a day for 7 days. Follow-up with your PCP as planned.

## 2023-07-04 NOTE — ED Triage Notes (Signed)
Pt presents with a non-productive cough x 2 days. He has tried OTC cold medication for symptoms.

## 2023-07-08 NOTE — Plan of Care (Signed)
[facility] Tonsillectomy/Adenoidectomy, Postoperative PEDS care plan entered in error.

## 2023-12-10 ENCOUNTER — Other Ambulatory Visit: Payer: Self-pay | Admitting: Neurology

## 2023-12-10 DIAGNOSIS — M542 Cervicalgia: Secondary | ICD-10-CM

## 2023-12-20 ENCOUNTER — Ambulatory Visit
Admission: RE | Admit: 2023-12-20 | Discharge: 2023-12-20 | Disposition: A | Discharge status: Home / Self Care | Source: Ambulatory Visit | Attending: Neurology | Admitting: Neurology

## 2023-12-20 DIAGNOSIS — M542 Cervicalgia: Secondary | ICD-10-CM
